# Patient Record
Sex: Female | Born: 1955
Health system: Southern US, Community
[De-identification: ages and names within clinical notes are randomized; demographics above are authoritative.]

## PROBLEM LIST (undated history)

## (undated) DIAGNOSIS — B009 Herpesviral infection, unspecified: Secondary | ICD-10-CM

## (undated) DIAGNOSIS — T7840XA Allergy, unspecified, initial encounter: Secondary | ICD-10-CM

## (undated) DIAGNOSIS — I10 Essential (primary) hypertension: Secondary | ICD-10-CM

## (undated) DIAGNOSIS — C801 Malignant (primary) neoplasm, unspecified: Secondary | ICD-10-CM

## (undated) DIAGNOSIS — E7800 Pure hypercholesterolemia, unspecified: Secondary | ICD-10-CM

## (undated) DIAGNOSIS — K649 Unspecified hemorrhoids: Secondary | ICD-10-CM

## (undated) DIAGNOSIS — Z889 Allergy status to unspecified drugs, medicaments and biological substances status: Secondary | ICD-10-CM

## (undated) DIAGNOSIS — Z86010 Personal history of colonic polyps: Secondary | ICD-10-CM

## (undated) DIAGNOSIS — E039 Hypothyroidism, unspecified: Secondary | ICD-10-CM

## (undated) DIAGNOSIS — D649 Anemia, unspecified: Secondary | ICD-10-CM

## (undated) HISTORY — DX: Allergy, unspecified, initial encounter: T78.40XA

## (undated) HISTORY — DX: Pure hypercholesterolemia, unspecified: E78.00

## (undated) HISTORY — DX: Unspecified hemorrhoids: K64.9

## (undated) HISTORY — DX: Personal history of colonic polyps: Z86.010

## (undated) HISTORY — PX: SALPINGOOPHORECTOMY: SHX82

## (undated) HISTORY — PX: BUNIONECTOMY: SHX129

---

## 1983-06-28 DIAGNOSIS — C801 Malignant (primary) neoplasm, unspecified: Secondary | ICD-10-CM

## 1983-06-28 HISTORY — DX: Malignant (primary) neoplasm, unspecified: C80.1

## 1994-06-27 HISTORY — PX: TUBAL LIGATION: SHX77

## 1998-03-06 ENCOUNTER — Ambulatory Visit (HOSPITAL_COMMUNITY): Admission: RE | Admit: 1998-03-06 | Discharge: 1998-03-06 | Payer: Self-pay | Admitting: *Deleted

## 1998-09-17 ENCOUNTER — Ambulatory Visit (HOSPITAL_COMMUNITY): Admission: RE | Admit: 1998-09-17 | Discharge: 1998-09-17 | Payer: Self-pay | Admitting: *Deleted

## 1999-02-26 ENCOUNTER — Ambulatory Visit (HOSPITAL_COMMUNITY): Admission: RE | Admit: 1999-02-26 | Discharge: 1999-02-26 | Payer: Self-pay | Admitting: Obstetrics and Gynecology

## 1999-02-26 ENCOUNTER — Encounter: Payer: Self-pay | Admitting: Obstetrics and Gynecology

## 1999-03-09 ENCOUNTER — Other Ambulatory Visit: Admission: RE | Admit: 1999-03-09 | Discharge: 1999-03-09 | Payer: Self-pay | Admitting: Obstetrics and Gynecology

## 1999-07-09 ENCOUNTER — Other Ambulatory Visit: Admission: RE | Admit: 1999-07-09 | Discharge: 1999-07-09 | Payer: Self-pay | Admitting: Obstetrics and Gynecology

## 2000-02-29 ENCOUNTER — Ambulatory Visit (HOSPITAL_COMMUNITY): Admission: RE | Admit: 2000-02-29 | Discharge: 2000-02-29 | Payer: Self-pay | Admitting: Pediatrics

## 2000-02-29 ENCOUNTER — Encounter: Payer: Self-pay | Admitting: Obstetrics and Gynecology

## 2000-05-15 ENCOUNTER — Other Ambulatory Visit: Admission: RE | Admit: 2000-05-15 | Discharge: 2000-05-15 | Payer: Self-pay | Admitting: Obstetrics and Gynecology

## 2001-03-02 ENCOUNTER — Ambulatory Visit (HOSPITAL_COMMUNITY): Admission: RE | Admit: 2001-03-02 | Discharge: 2001-03-02 | Payer: Self-pay | Admitting: Obstetrics and Gynecology

## 2001-03-02 ENCOUNTER — Encounter: Payer: Self-pay | Admitting: Obstetrics and Gynecology

## 2001-06-26 ENCOUNTER — Other Ambulatory Visit: Admission: RE | Admit: 2001-06-26 | Discharge: 2001-06-26 | Payer: Self-pay | Admitting: Obstetrics and Gynecology

## 2001-09-05 ENCOUNTER — Encounter: Payer: Self-pay | Admitting: Family Medicine

## 2001-09-05 ENCOUNTER — Encounter: Admission: RE | Admit: 2001-09-05 | Discharge: 2001-09-05 | Payer: Self-pay | Admitting: Family Medicine

## 2002-04-10 ENCOUNTER — Encounter: Payer: Self-pay | Admitting: Obstetrics and Gynecology

## 2002-04-10 ENCOUNTER — Ambulatory Visit (HOSPITAL_COMMUNITY): Admission: RE | Admit: 2002-04-10 | Discharge: 2002-04-10 | Payer: Self-pay | Admitting: Obstetrics and Gynecology

## 2002-07-11 ENCOUNTER — Other Ambulatory Visit: Admission: RE | Admit: 2002-07-11 | Discharge: 2002-07-11 | Payer: Self-pay | Admitting: Obstetrics and Gynecology

## 2003-04-07 ENCOUNTER — Encounter: Payer: Self-pay | Admitting: Obstetrics and Gynecology

## 2003-04-07 ENCOUNTER — Encounter: Admission: RE | Admit: 2003-04-07 | Discharge: 2003-04-07 | Payer: Self-pay | Admitting: Obstetrics and Gynecology

## 2003-08-06 ENCOUNTER — Other Ambulatory Visit: Admission: RE | Admit: 2003-08-06 | Discharge: 2003-08-06 | Payer: Self-pay | Admitting: Obstetrics and Gynecology

## 2004-04-12 ENCOUNTER — Encounter: Admission: RE | Admit: 2004-04-12 | Discharge: 2004-04-12 | Payer: Self-pay | Admitting: Obstetrics and Gynecology

## 2005-05-03 ENCOUNTER — Other Ambulatory Visit: Admission: RE | Admit: 2005-05-03 | Discharge: 2005-05-03 | Payer: Self-pay | Admitting: Obstetrics and Gynecology

## 2005-05-31 ENCOUNTER — Encounter: Admission: RE | Admit: 2005-05-31 | Discharge: 2005-05-31 | Payer: Self-pay | Admitting: Obstetrics and Gynecology

## 2006-06-19 ENCOUNTER — Encounter: Admission: RE | Admit: 2006-06-19 | Discharge: 2006-06-19 | Payer: Self-pay | Admitting: Obstetrics and Gynecology

## 2006-09-06 ENCOUNTER — Other Ambulatory Visit: Admission: RE | Admit: 2006-09-06 | Discharge: 2006-09-06 | Payer: Self-pay | Admitting: Obstetrics and Gynecology

## 2007-07-14 ENCOUNTER — Encounter: Admission: RE | Admit: 2007-07-14 | Discharge: 2007-07-14 | Payer: Self-pay | Admitting: Family Medicine

## 2007-07-19 ENCOUNTER — Encounter: Admission: RE | Admit: 2007-07-19 | Discharge: 2007-07-19 | Payer: Self-pay | Admitting: Obstetrics and Gynecology

## 2007-07-24 ENCOUNTER — Encounter: Admission: RE | Admit: 2007-07-24 | Discharge: 2007-07-24 | Payer: Self-pay | Admitting: Obstetrics and Gynecology

## 2007-08-07 ENCOUNTER — Encounter: Admission: RE | Admit: 2007-08-07 | Discharge: 2007-08-07 | Payer: Self-pay | Admitting: Obstetrics and Gynecology

## 2007-08-15 ENCOUNTER — Encounter: Admission: RE | Admit: 2007-08-15 | Discharge: 2007-08-15 | Payer: Self-pay | Admitting: Obstetrics and Gynecology

## 2007-10-29 ENCOUNTER — Other Ambulatory Visit: Admission: RE | Admit: 2007-10-29 | Discharge: 2007-10-29 | Payer: Self-pay | Admitting: Obstetrics and Gynecology

## 2007-11-13 ENCOUNTER — Ambulatory Visit: Payer: Self-pay | Admitting: Internal Medicine

## 2007-11-26 ENCOUNTER — Telehealth (INDEPENDENT_AMBULATORY_CARE_PROVIDER_SITE_OTHER): Payer: Self-pay | Admitting: *Deleted

## 2007-11-26 ENCOUNTER — Encounter: Payer: Self-pay | Admitting: Internal Medicine

## 2007-11-27 ENCOUNTER — Ambulatory Visit: Payer: Self-pay | Admitting: Internal Medicine

## 2008-07-30 ENCOUNTER — Encounter: Admission: RE | Admit: 2008-07-30 | Discharge: 2008-07-30 | Payer: Self-pay | Admitting: Obstetrics and Gynecology

## 2008-11-07 ENCOUNTER — Other Ambulatory Visit: Admission: RE | Admit: 2008-11-07 | Discharge: 2008-11-07 | Payer: Self-pay | Admitting: Obstetrics and Gynecology

## 2009-09-09 ENCOUNTER — Encounter: Admission: RE | Admit: 2009-09-09 | Discharge: 2009-09-09 | Payer: Self-pay | Admitting: Obstetrics and Gynecology

## 2010-12-02 ENCOUNTER — Other Ambulatory Visit: Payer: Self-pay | Admitting: Obstetrics and Gynecology

## 2010-12-02 DIAGNOSIS — Z1231 Encounter for screening mammogram for malignant neoplasm of breast: Secondary | ICD-10-CM

## 2010-12-07 ENCOUNTER — Ambulatory Visit
Admission: RE | Admit: 2010-12-07 | Discharge: 2010-12-07 | Disposition: A | Discharge status: Home / Self Care | Payer: 59 | Source: Ambulatory Visit | Attending: Obstetrics and Gynecology | Admitting: Obstetrics and Gynecology

## 2010-12-07 DIAGNOSIS — Z1231 Encounter for screening mammogram for malignant neoplasm of breast: Secondary | ICD-10-CM

## 2011-01-24 ENCOUNTER — Emergency Department (HOSPITAL_COMMUNITY)
Admission: EM | Admit: 2011-01-24 | Discharge: 2011-01-24 | Disposition: A | Discharge status: Home / Self Care | Payer: 59 | Attending: Emergency Medicine | Admitting: Emergency Medicine

## 2011-01-24 ENCOUNTER — Emergency Department (HOSPITAL_COMMUNITY): Payer: 59

## 2011-01-24 DIAGNOSIS — R141 Gas pain: Secondary | ICD-10-CM | POA: Insufficient documentation

## 2011-01-24 DIAGNOSIS — R142 Eructation: Secondary | ICD-10-CM | POA: Insufficient documentation

## 2011-01-24 DIAGNOSIS — Z79899 Other long term (current) drug therapy: Secondary | ICD-10-CM | POA: Insufficient documentation

## 2011-01-24 DIAGNOSIS — E7800 Pure hypercholesterolemia, unspecified: Secondary | ICD-10-CM | POA: Insufficient documentation

## 2011-01-24 DIAGNOSIS — R42 Dizziness and giddiness: Secondary | ICD-10-CM | POA: Insufficient documentation

## 2011-01-24 DIAGNOSIS — R319 Hematuria, unspecified: Secondary | ICD-10-CM | POA: Insufficient documentation

## 2011-01-24 LAB — DIFFERENTIAL
Basophils Relative: 0 % (ref 0–1)
Eosinophils Absolute: 0 10*3/uL (ref 0.0–0.7)
Lymphs Abs: 1.7 10*3/uL (ref 0.7–4.0)
Neutro Abs: 5.6 10*3/uL (ref 1.7–7.7)
Neutrophils Relative %: 71 % (ref 43–77)

## 2011-01-24 LAB — BASIC METABOLIC PANEL
BUN: 15 mg/dL (ref 6–23)
Calcium: 9.7 mg/dL (ref 8.4–10.5)
Creatinine, Ser: 0.64 mg/dL (ref 0.50–1.10)
GFR calc Af Amer: >60 mL/min (ref >60)
GFR calc non Af Amer: >60 mL/min (ref >60)
Glucose, Bld: 102 mg/dL — ABNORMAL HIGH (ref 70–99)
Potassium: 3.9 mEq/L (ref 3.5–5.1)

## 2011-01-24 LAB — CBC
MCH: 28.6 pg (ref 26.0–34.0)
MCHC: 34.1 g/dL (ref 30.0–36.0)
RBC: 4.51 MIL/uL (ref 3.87–5.11)
RDW: 12.8 % (ref 11.5–15.5)
WBC: 7.8 10*3/uL (ref 4.0–10.5)

## 2011-01-24 LAB — URINALYSIS, ROUTINE W REFLEX MICROSCOPIC
Bilirubin Urine: NEGATIVE
Ketones, ur: NEGATIVE mg/dL
Nitrite: NEGATIVE
Urobilinogen, UA: 0.2 mg/dL (ref 0.0–1.0)

## 2011-01-24 LAB — URINE MICROSCOPIC-ADD ON

## 2011-01-24 LAB — POCT PREGNANCY, URINE: Preg Test, Ur: NEGATIVE

## 2012-01-05 ENCOUNTER — Other Ambulatory Visit: Payer: Self-pay | Admitting: Obstetrics and Gynecology

## 2012-01-05 DIAGNOSIS — Z1231 Encounter for screening mammogram for malignant neoplasm of breast: Secondary | ICD-10-CM

## 2012-01-09 ENCOUNTER — Ambulatory Visit
Admission: RE | Admit: 2012-01-09 | Discharge: 2012-01-09 | Disposition: A | Discharge status: Home / Self Care | Payer: 59 | Source: Ambulatory Visit | Attending: Obstetrics and Gynecology | Admitting: Obstetrics and Gynecology

## 2012-01-09 DIAGNOSIS — Z1231 Encounter for screening mammogram for malignant neoplasm of breast: Secondary | ICD-10-CM

## 2012-12-31 ENCOUNTER — Encounter: Payer: Self-pay | Admitting: Obstetrics and Gynecology

## 2013-01-02 ENCOUNTER — Encounter: Payer: Self-pay | Admitting: Obstetrics and Gynecology

## 2013-01-02 ENCOUNTER — Ambulatory Visit: Payer: Self-pay | Admitting: Obstetrics and Gynecology

## 2013-01-02 DIAGNOSIS — Z01419 Encounter for gynecological examination (general) (routine) without abnormal findings: Secondary | ICD-10-CM

## 2013-02-28 ENCOUNTER — Other Ambulatory Visit: Payer: Self-pay

## 2013-02-28 DIAGNOSIS — Z1231 Encounter for screening mammogram for malignant neoplasm of breast: Secondary | ICD-10-CM

## 2013-03-19 ENCOUNTER — Ambulatory Visit
Admission: RE | Admit: 2013-03-19 | Discharge: 2013-03-19 | Disposition: A | Discharge status: Home / Self Care | Payer: 59 | Source: Ambulatory Visit

## 2013-03-19 DIAGNOSIS — Z1231 Encounter for screening mammogram for malignant neoplasm of breast: Secondary | ICD-10-CM

## 2014-04-08 ENCOUNTER — Other Ambulatory Visit: Payer: Self-pay

## 2014-04-08 DIAGNOSIS — Z1231 Encounter for screening mammogram for malignant neoplasm of breast: Secondary | ICD-10-CM

## 2014-04-18 ENCOUNTER — Ambulatory Visit
Admission: RE | Admit: 2014-04-18 | Discharge: 2014-04-18 | Disposition: A | Discharge status: Home / Self Care | Payer: 59 | Source: Ambulatory Visit

## 2014-04-18 DIAGNOSIS — Z1231 Encounter for screening mammogram for malignant neoplasm of breast: Secondary | ICD-10-CM

## 2014-04-28 ENCOUNTER — Encounter: Payer: Self-pay | Admitting: Obstetrics and Gynecology

## 2014-05-02 ENCOUNTER — Encounter: Payer: Self-pay | Admitting: Podiatrist

## 2014-05-02 ENCOUNTER — Ambulatory Visit (INDEPENDENT_AMBULATORY_CARE_PROVIDER_SITE_OTHER): Payer: 59 | Admitting: Podiatrist

## 2014-05-02 ENCOUNTER — Ambulatory Visit (INDEPENDENT_AMBULATORY_CARE_PROVIDER_SITE_OTHER): Payer: 59

## 2014-05-02 VITALS — BP 115/62 | HR 80 | Resp 16

## 2014-05-02 DIAGNOSIS — M205X9 Other deformities of toe(s) (acquired), unspecified foot: Secondary | ICD-10-CM

## 2014-05-02 DIAGNOSIS — M2010 Hallux valgus (acquired), unspecified foot: Secondary | ICD-10-CM

## 2014-05-02 NOTE — Patient Instructions (Addendum)
Pre-Operative Instructions  Congratulations, you have decided to take an important step to improving your quality of life.  You can be assured that the doctors of [facility] will be with you every step of the way.  1. Plan to be at the surgery center/hospital at least 1 (one) hour prior to your scheduled time unless otherwise directed by the surgical center/hospital staff.  You must have a responsible adult accompany you, remain during the surgery and drive you home.  Make sure you have directions to the surgical center/hospital and know how to get there on time. 2. For hospital based surgery you will need to obtain a history and physical form from your family physician within 1 month prior to the date of surgery- we will give you a form for you primary physician.  3. We make every effort to accommodate the date you request for surgery.  There are however, times where surgery dates or times have to be moved.  We will contact you as soon as possible if a change in schedule is required.   4. No Aspirin/Ibuprofen for one week before surgery.  If you are on aspirin, any non-steroidal anti-inflammatory medications (Mobic, Aleve, Ibuprofen) you should stop taking it 7 days prior to your surgery.  You make take Tylenol  For pain prior to surgery.  5. Medications- If you are taking daily heart and blood pressure medications, seizure, reflux, allergy, asthma, anxiety, pain or diabetes medications, make sure the surgery center/hospital is aware before the day of surgery so they may notify you which medications to take or avoid the day of surgery. 6. No food or drink after midnight the night before surgery unless directed otherwise by surgical center/hospital staff. 7. No alcoholic beverages 24 hours prior to surgery.  No smoking 24 hours prior to or 24 hours after surgery. 8. Wear loose pants or shorts- loose enough to fit over bandages, boots, and casts. 9. No slip on shoes, sneakers are best. 10. Bring  your boot with you to the surgery center/hospital.  Also bring crutches or a walker if your physician has prescribed it for you.  If you do not have this equipment, it will be provided for you after surgery. 11. If you have not been contracted by the surgery center/hospital by the day before your surgery, call to confirm the date and time of your surgery. 12. Leave-time from work may vary depending on the type of surgery you have.  Appropriate arrangements should be made prior to surgery with your employer. 13. Prescriptions will be provided immediately following surgery by your doctor.  Have these filled as soon as possible after surgery and take the medication as directed. 14. Remove nail polish on the operative foot. 15. Wash the night before surgery.  The night before surgery wash the foot and leg well with the antibacterial soap provided and water paying special attention to beneath the toenails and in between the toes.  Rinse thoroughly with water and dry well with a towel.  Perform this wash unless told not to do so by your physician.  Enclosed: 1 Ice pack (please put in freezer the night before surgery)   1 Hibiclens skin cleaner   Pre-op Instructions  If you have any questions regarding the instructions, do not hesitate to call our office.  [place]: [address] [place], [place] [address] [phone]  [place]: [address] [place], [place] [address] [phone]  [place]: [address]  [place], [place] [address] [phone]  Dr. Richard   Tuchman DPM, Dr. Ila Mcgill DPM Dr. Harriet Masson DPM, Dr. Lanelle Bal DPM, Dr. Trudie Buckler DPM  Bunionectomy A bunionectomy is surgery to remove a bunion. A bunion is an enlargement of the joint at the base of the big toe. It is made up of bone and soft tissue on the inside part of the joint. Over time, a painful lump appears on the inside of the joint. The big toe begins to point inward toward the second toe. New bone growth can occur  and a bone spur may form. The pain eventually causes difficulty walking. A bunion usually results from inflammation caused by the irritation of poorly fitting shoes. It often begins later in life. A bunionectomy is performed when nonsurgical treatment no longer works. When surgery is needed, the extent of the procedure will depend on the degree of deformity of the foot. Your surgeon will discuss with you the different procedures and what will work best for you depending on your age and health. LET YOUR CAREGIVER KNOW ABOUT:  16. Previous problems with anesthetics or medicines used to numb the skin. 17. Allergies to dyes, iodine, foods, and/or latex. 18. Medicines taken including herbs, eye drops, prescription medicines (especially medicines used to "thin the blood"), aspirin and other over-the-counter medicines, and steroids (by mouth or as a cream). 19. History of bleeding or blood problems. 20. Possibility of pregnancy, if this applies. 21. History of blood clots in your legs and/or lungs . 22. Previous surgery. 23. Other important health problems. RISKS AND COMPLICATIONS   Infection.  Pain.  Nerve damage.  Possibility that the bunion will recur. BEFORE THE PROCEDURE  You should be present 60 minutes prior to your procedure or as directed.  PROCEDURE  Surgery is often done so that you can go home the same day (outpatient). It may be done in a hospital or in an outpatient surgical center. An anesthetic will be used to help you sleep during the procedure. Sometimes, a spinal anesthetic is used to make you numb below the waist. A cut (incision) is made over the swollen area at the first joint of the big toe. The enlarged lump will be removed. If there is a need to reposition the bones of the big toe, this may require more than 1 incision. The bone itself may need to be cut. Screws and wires may be used in the repair. These can be removed at a later date. In severe cases, the entire joint may  need to be removed and a joint replacement inserted. When done, the incision is closed with stitches (sutures). Skin adhesive strips may be added for reinforcement. They help hold the incision closed.  AFTER THE PROCEDURE  Compression bandages (dressings) are then wrapped around the wound. This helps to keep the foot in alignment and reduce swelling. Your foot will be monitored for bleeding and swelling. You will need to stay for a few hours in the recovery area before being discharged. This allows time for the anesthesia to wear off. You will be discharged home when you are awake, stable, and doing well. HOME CARE INSTRUCTIONS   You can expect to return to normal activities within 6 to 8 weeks after surgery. The foot is at increased risk for swelling for several months. When you can expect to bear weight on the operated foot will depend on the extent of your surgery. The milder the deformity, the less tissue is removed and the sooner the return to normal activity level. During the  recovery period, a special shoe, boot, or cast may be worn to accommodate the surgical bandage and to help provide stability to the foot.  Once you are home, an ice pack applied to the operative site may help with discomfort and keep swelling down. Stop using the ice if it causes discomfort.  Keep your feet raised (elevated) when possible to lessen swelling.  If you have an elastic bandage on your foot and you have numbness, tingling, or your foot becomes cold and blue, adjust the bandage to make it comfortable.  Change dressings as directed.  Keep the wound dry and clean. The wound may be washed gently with soap and water. Gently blot dry without rubbing. Do not take baths or use swimming pools or hot tubs for 10 days, or as instructed by your caregiver.  Only take over-the-counter or prescription medicines for pain, discomfort, or fever as directed by your caregiver.  You may continue a normal diet as directed.  For  activity, use crutches with no weight bearing or your orthopedic shoe as directed. Continue to use crutches or a cane as directed until you can stand without causing pain. SEEK MEDICAL CARE IF:   You have redness, swelling, bruising, or increasing pain in the wound.  There is pus coming from the wound.  You have drainage from a wound lasting longer than 1 day.  You have an oral temperature above 102 F (38.9 C).  You notice a bad smell coming from the wound or dressing.  The wound breaks open after sutures have been removed.  You develop dizzy episodes or fainting while standing.  You have persistent nausea or vomiting.  Your toes become cold.  Pain is not relieved with medicines. SEEK IMMEDIATE MEDICAL CARE IF:   You develop a rash.  You have difficulty breathing.  You develop any reaction or side effects to medicines given.  Your toes are numb or blue, or you have severe pain. MAKE SURE YOU:   Understand these instructions.  Will watch your condition.  Will get help right away if you are not doing well or get worse. Document Released: 05/27/2005 Document Revised: 09/05/2011 Document Reviewed: 07/02/2007 [facility] Patient Information 2015 [place], [place]. This information is not intended to replace advice given to you by your health care provider. Make sure you discuss any questions you have with your health care provider.

## 2014-05-02 NOTE — Progress Notes (Signed)
   Subjective:    Patient ID: Brittany Ali, female    DOB: Dec 16, 1955, 57 y.o.   MRN: [id number]  HPI Comments: "I have bunions"  Patient c/o aching 1st MPJ bilateral for about 1 year. The are red and swollen. Shoes are uncomfortable. She is an active runner but has had to stop completely.  Foot Pain Associated symptoms include arthralgias.      Review of Systems  Constitutional: Positive for activity change.  Musculoskeletal: Positive for arthralgias and gait problem.  All other systems reviewed and are negative.      Objective:   Physical Exam Patient is awake, alert, and oriented x 3.  In no acute distress.  Vascular status is intact with palpable pedal pulses at 2/4 DP and PT bilateral and capillary refill time within normal limits. Neurological sensation is also intact bilaterally via Semmes Weinstein monofilament at 5/5 sites. Light touch, vibratory sensation, Achilles tendon reflex is intact. Dermatological exam reveals skin color, turger and texture as normal. No open lesions present.  Musculature intact with dorsiflexion, plantarflexion, inversion, eversion.  Hallux limitus rigidus is noted on the right great toe joint. Crepitus and pain with range of motion is noted. X-ray showed decrease in range of motion at the joint itself.       Assessment & Plan:  Right foot hallux limitus-  Plan:  Discussed conservative versus surgical options. Recommended a repair of the hallux limitus to clean up the joint and allow for better range of motion. The consent form was discussed and all three pages were signed and the patient's questions were encouraged and answered to the best of my ability. Risks of the surgery were discussed including but not limited to continued pain, infection, swelling, elevated toe, decreased range of motion,  suture or implant reaction, bleeding, decreased function, etc. Preoperative instructions were also dispensed to the patient as well as a preoperative  surgical pamphlet to go along with the instructions. Surgery will be scheduled at the patients convenience and patient will be seen at [facility] specialty surgery center on outpatient basis.The patient is instructed to call if any questions or concerns arise.

## 2014-05-05 ENCOUNTER — Telehealth: Payer: Self-pay | Admitting: *Deleted

## 2014-05-05 NOTE — Telephone Encounter (Signed)
I'm calling to speak to Mrs. Brittany Ali.  I was there on Friday.  I need to schedule foot surgery.  I was to check my calendar to see what was best suitable and now I'm calling to see if it works for you guys.  Either my work number or my cell number will work for me.  Thank you.

## 2014-05-05 NOTE — Telephone Encounter (Signed)
"  I'd like to schedule surgery for 11/18."  I asked if she signed consent forms.  "I saw Dr. Valentina Lucks on Friday and signed them  She told me to call and schedule a date.  I'd like to do it as soon as possible.  She said it would take 45 minutes to do the procedure.  She also said I should get a boot which I didn't.  So I guess I need to come by there to get one?"  I told her I would call her back.  I am not aware of the procedure and Dr. Valentina Lucks will be out of the office until tomorrow in [place] office. She stated, "That is fine, please go ahead and pencil me in for Wednesday of next week."

## 2014-05-06 NOTE — Telephone Encounter (Signed)
I'll just get the surgery center to get her the boot-- just make sure she has the forms to fill out for the surgery center. Thanks!

## 2014-05-07 NOTE — Telephone Encounter (Signed)
I called and scheduled the surgery.

## 2014-05-07 NOTE — Telephone Encounter (Signed)
"  I thought I'd call you back since I didn't hear from you yesterday.  I want to know the status.  Will I be able to have the surgery on Wednesday?"  I told her it shouldn't be a problem.  Dr. Valentina Lucks was not in the office yesterday.  She will be here today and I can find out the procedure.  "I thought she did surgery on Wednesday."  She does on Wednesday morning.  I asked if she received a surgical packet.  She stated she did.  I asked her to go ahead and fill those forms out.  The surgical center will call you a day or two ahead of time and give you your arrival time.  I will call you and let you know when I get it scheduled.

## 2014-05-07 NOTE — Telephone Encounter (Signed)
I called and informed the patient that Dr. Valentina Lucks will be out of the office next week.  We will have to reschedule the surgery.  "Just my luck, I guess we'll just be able to do only one foot this year.  Can we schedule it for 06/11/2014?"  I told her that date is fine.   I called and left Caren Griffins a message that surgery needs to be rescheduled from 05/14/2014 to 06/11/2014.

## 2014-05-08 ENCOUNTER — Telehealth: Payer: Self-pay | Admitting: *Deleted

## 2014-05-08 NOTE — Telephone Encounter (Signed)
"  My husband and I have discussed it an we've decided to go ahead and do the surgery on November 25.  If you could leave me on for December 16, I want to have my left foot done that day.  If I have problems with pain or any other issues I can cancel the second foot at any time correct?"  I told her yes, you can cancel at anytime.  "Will you call me to confirm the surgery?"  I told her it will definitely be scheduled for these days.  "That's what you told me last time and look what happened."  I told her again it will be taken care of with no problems.  I will inform Dr. Valentina Lucks.  I called and informed Caren Griffins at the surgical center to reschedule her surgery from 06/11/2014 to 05/21/2014 for her right foot and schedule her tentatively for her left foot on 06/11/2014.

## 2014-05-21 ENCOUNTER — Encounter: Payer: 59 | Admitting: Podiatrist

## 2014-05-21 DIAGNOSIS — M2011 Hallux valgus (acquired), right foot: Secondary | ICD-10-CM

## 2014-05-26 ENCOUNTER — Telehealth: Payer: Self-pay | Admitting: *Deleted

## 2014-05-26 NOTE — Telephone Encounter (Signed)
Pt asked how long should she be on her feet after bunion surgery on 05/21/2014.  I told pt no more than 5 minutes per hour the 1st week post-op and may increase by 5 minutes each hour per weeks post-op.  Pt agreed.

## 2014-05-29 ENCOUNTER — Ambulatory Visit (INDEPENDENT_AMBULATORY_CARE_PROVIDER_SITE_OTHER): Payer: 59 | Admitting: Podiatrist

## 2014-05-29 ENCOUNTER — Encounter: Payer: Self-pay | Admitting: Podiatrist

## 2014-05-29 ENCOUNTER — Ambulatory Visit (INDEPENDENT_AMBULATORY_CARE_PROVIDER_SITE_OTHER): Payer: 59

## 2014-05-29 VITALS — BP 126/53 | HR 84 | Resp 13

## 2014-05-29 DIAGNOSIS — M2011 Hallux valgus (acquired), right foot: Secondary | ICD-10-CM

## 2014-05-29 DIAGNOSIS — M21611 Bunion of right foot: Secondary | ICD-10-CM

## 2014-05-29 DIAGNOSIS — Z9889 Other specified postprocedural states: Secondary | ICD-10-CM

## 2014-05-29 NOTE — Progress Notes (Signed)
Subjective: Patient presents today1 week status post foot surgery of the right foot.  Date of surgery 05/21/14-  A [place] osteotomy was performed. Patient denies nausea, vomiting, fevers, chills or night sweats.  Denies calf pain or tenderness to the operative side. Relates she has not been taking much pain medication for the foot and feelis it is improving daily.    Objective:  Neurovascular status is intact with palpable pedal pulses DP and PT bilateral at 2+ out of 4. Neurological sensation is intact and unchanged as per prior to surgery. Excellent appearance of the postoperative foot is noted clinically and radiographically.   Assessment: Status post right Arnetha Gula 05/21/14  Plan:  Dressing removed and a new dsd applied.  Dispensed a darco shoe and in 1 week she may get the foot wet and start wearing the darco shoe.  Discussed surgery on the left foot consisting of a austin bunoin correction.  She is scheduled at [facility] for this surgery.  If any concerns arise prior to that visit she will call.

## 2014-06-09 ENCOUNTER — Telehealth: Payer: Self-pay | Admitting: *Deleted

## 2014-06-09 NOTE — Telephone Encounter (Signed)
I called and informed the patient that Dr. Valentina Lucks said it would be okay to have the procedure as long as there is no chest congestion.  "Okay, I had already spoken to the surgical center, they called.  They told me the same thing so we should be good to go.  I only had a head cold and it was in my throat.  Thanks for calling me back."

## 2014-06-09 NOTE — Telephone Encounter (Signed)
I'm scheduled for surgery tomorrow at 12 noon.  I had a cold all last week.  They said it depends on how I felt today.  I feel 100% better but I did go to Urgent Care on Saturday because I was feeling pretty rough.  They put me on Amoxicillin 500 mg and some Claritussin AC which I guess has a little Codeine in it.  I took that for the last time last night.  I haven't taken any today because I'm at work today.  I want to make sure I can still have the surgery being on the Amoxicillin?  I know you guys may give me some type of antibiotic anyway so it shouldn't matter.

## 2014-06-09 NOTE — Telephone Encounter (Signed)
Yes, that's totally fine to be on an antibiotic and any type of cold medicine is fine as well  The only thing anesthesia worries about is really bad chest congestion -- but as long as she doesn't have this, she should be just fine.  Thanks!  E

## 2014-06-10 DIAGNOSIS — M2012 Hallux valgus (acquired), left foot: Secondary | ICD-10-CM

## 2014-06-18 ENCOUNTER — Other Ambulatory Visit: Payer: Self-pay | Admitting: Podiatrist

## 2014-06-18 ENCOUNTER — Ambulatory Visit (INDEPENDENT_AMBULATORY_CARE_PROVIDER_SITE_OTHER): Payer: 59 | Admitting: Podiatrist

## 2014-06-18 ENCOUNTER — Encounter: Payer: 59 | Admitting: Podiatrist

## 2014-06-18 ENCOUNTER — Ambulatory Visit (INDEPENDENT_AMBULATORY_CARE_PROVIDER_SITE_OTHER): Payer: 59

## 2014-06-18 ENCOUNTER — Encounter: Payer: Self-pay | Admitting: Podiatrist

## 2014-06-18 VITALS — BP 118/62 | HR 70 | Resp 16

## 2014-06-18 DIAGNOSIS — Z9889 Other specified postprocedural states: Secondary | ICD-10-CM

## 2014-06-18 DIAGNOSIS — M21611 Bunion of right foot: Secondary | ICD-10-CM

## 2014-06-18 DIAGNOSIS — M2011 Hallux valgus (acquired), right foot: Secondary | ICD-10-CM

## 2014-06-18 DIAGNOSIS — M2012 Hallux valgus (acquired), left foot: Secondary | ICD-10-CM

## 2014-06-18 NOTE — Progress Notes (Signed)
Chief Complaint  Patient presents with  . Routine Post Op    DOS 06-11-2014 POV [place] bunionectomy left   "Good. Not as good as the other foot though"  . Routine Post Op    DOS 05-21-2014 POV [place] bunonectomy right   "Looking so good"     HPI: Patient is 58 y.o. female who presents today for postoperative follow-up regarding bilateral foot surgery. She relates the right foot is doing excellent she's having no problems in the left foot is slower to come around. She did however just have surgery on the left foot last week.   Allergies  Allergen Reactions  . Adhesive [Tape]     Physical Exam  Neurovascular status is intact and unchanged. Bruising and ecchymosis is present on the left foot. Incision site is well coapted. Mild swelling is present. Range of motion is adequate. Range of motion is excellent on the right foot. X-rays show well-healing surgical foot with excellent appearance of osteotomies bilateral and screw fixation in good position and alignment. Overall good postoperative improvement is noted.  Assessment: Status post right foot surgery on 05/21/2014; and status post left foot surgery on 06/11/2014  Plan: Redressed the left foot in a dry in sterile compressive dressing. She may get the foot wet next week. She may also wear the Darco shoe on the left foot next week. On the right foot a compressive glue was also dispensed. She may wean into a good supportive tennis shoe or sneaker. She is given instructions for range of motion exercises. I will see her back in 2-3 weeks for follow-up.

## 2014-06-18 NOTE — Patient Instructions (Signed)
Continue with your range of motion exercises on your great toes-- also pull the toes out and push the toe up   Complete the exercise 50 times and repeat 3-4 times a day.

## 2014-07-10 ENCOUNTER — Other Ambulatory Visit (HOSPITAL_COMMUNITY)
Admission: RE | Admit: 2014-07-10 | Discharge: 2014-07-10 | Disposition: A | Discharge status: Home / Self Care | Payer: 59 | Source: Ambulatory Visit | Attending: Family Medicine | Admitting: Family Medicine

## 2014-07-10 ENCOUNTER — Other Ambulatory Visit: Payer: Self-pay | Admitting: Family Medicine

## 2014-07-10 DIAGNOSIS — Z124 Encounter for screening for malignant neoplasm of cervix: Secondary | ICD-10-CM | POA: Diagnosis not present

## 2014-07-10 DIAGNOSIS — Z1151 Encounter for screening for human papillomavirus (HPV): Secondary | ICD-10-CM | POA: Insufficient documentation

## 2014-07-11 ENCOUNTER — Ambulatory Visit (INDEPENDENT_AMBULATORY_CARE_PROVIDER_SITE_OTHER): Payer: 59

## 2014-07-11 ENCOUNTER — Encounter: Payer: Self-pay | Admitting: Podiatrist

## 2014-07-11 ENCOUNTER — Ambulatory Visit (INDEPENDENT_AMBULATORY_CARE_PROVIDER_SITE_OTHER): Payer: 59 | Admitting: Podiatrist

## 2014-07-11 VITALS — BP 122/60 | HR 71 | Resp 16

## 2014-07-11 DIAGNOSIS — Z9889 Other specified postprocedural states: Secondary | ICD-10-CM

## 2014-07-11 DIAGNOSIS — M2010 Hallux valgus (acquired), unspecified foot: Secondary | ICD-10-CM

## 2014-07-11 NOTE — Progress Notes (Signed)
Chief Complaint  Patient presents with  . Routine Post Op    DOS 06-11-2014 POV [place] bunionectomy left   "Seems like its taking longer to heal, but maybe cause it was the lastest"  . Routine Post Op    DOS 05-21-2014 POV [place] bunonectomy right   "It just has like a bruised like feeling"     HPI: Patient is 59 y.o. female who presents today for postoperative follow-up status post left and right bunion surgery correction. Patient states overall she is doing well and is getting along okay postoperatively. Allergies  Allergen Reactions  . Adhesive [Tape]      Physical Exam  Neurovascular status is intact and unchanged. swelling is present on the left foot  Mild swelling still present right. Incision site is well coapted. . Range of motion is adequate on the left foot but improved. Range of motion is excellent on the right foot.   X-rays show a healed osteotomy on the right foot and a well-healing osteotomy on the left. Capital fragments are in good alignment and position with screw fixation in place.   Assessment: Status post right foot surgery on 05/21/2014; and status post left foot surgery on 06/11/2014  Plan: Recommended continued range of motion exercises bilaterally. Also recommended use of a good supportive sneaker on both feet. She will continue wearing the anklet on the left foot and discontinue use of it on the right. I'll see her back in 1 month for reevaluation and re-x-ray and at that time I would expect less swelling on the left than at today's visit. Also note she did recently go bowling and states that she had some swelling on the left foot however the right did wonderfully.

## 2014-07-14 LAB — CYTOLOGY - PAP

## 2014-08-08 ENCOUNTER — Encounter: Payer: 59 | Admitting: Podiatrist

## 2014-08-13 ENCOUNTER — Ambulatory Visit (INDEPENDENT_AMBULATORY_CARE_PROVIDER_SITE_OTHER): Payer: 59 | Admitting: Podiatrist

## 2014-08-13 ENCOUNTER — Ambulatory Visit (INDEPENDENT_AMBULATORY_CARE_PROVIDER_SITE_OTHER): Payer: 59

## 2014-08-13 ENCOUNTER — Encounter: Payer: Self-pay | Admitting: Podiatrist

## 2014-08-13 VITALS — BP 100/56 | HR 79 | Resp 16

## 2014-08-13 DIAGNOSIS — M2010 Hallux valgus (acquired), unspecified foot: Secondary | ICD-10-CM

## 2014-08-20 NOTE — Progress Notes (Signed)
Chief Complaint  Patient presents with  . Routine Post Op    DOS 06-11-2014 POV [place] bunionectomy left  "Its feeling okay"  . Routine Post Op    DOS 05-21-2014 POV [place] bunonectomy right  "Some soreness but okay"     HPI: Patient is 59 y.o. female who presents today for postoperative follow-up status post left and right bunion surgery correction.   Allergies  Allergen Reactions  . Adhesive [Tape]      Physical Exam  Neurovascular status is intact.  Mild swelling still present right. Incision site is healed. . Range of motion is adequate on the left foot but improved. Range of motion is excellent on the right foot.   X-rays show a healed osteotomy on the right foot and a well-healing osteotomy on the left.   Assessment: Status post right foot surgery on 05/21/2014; and status post left foot surgery on 06/11/2014  Plan: Recommended continued range of motion exercises bilaterally. Also recommended use of a good supportive sneaker on both feet. She will continue to use compression when necessary.  Overall she is doing well and will call if any concerns arise.

## 2015-07-23 ENCOUNTER — Other Ambulatory Visit: Payer: Self-pay | Admitting: Orthopedic Surgery

## 2015-07-23 DIAGNOSIS — M532X8 Spinal instabilities, sacral and sacrococcygeal region: Secondary | ICD-10-CM

## 2015-07-31 ENCOUNTER — Other Ambulatory Visit: Payer: 59

## 2015-08-19 ENCOUNTER — Other Ambulatory Visit: Payer: Self-pay | Admitting: Orthopedic Surgery

## 2015-08-19 DIAGNOSIS — M5416 Radiculopathy, lumbar region: Secondary | ICD-10-CM

## 2015-08-19 DIAGNOSIS — M533 Sacrococcygeal disorders, not elsewhere classified: Secondary | ICD-10-CM

## 2015-09-10 ENCOUNTER — Other Ambulatory Visit: Payer: Self-pay | Admitting: Physician Assistant

## 2015-09-15 ENCOUNTER — Encounter: Payer: Self-pay | Admitting: Internal Medicine

## 2015-09-18 ENCOUNTER — Encounter (HOSPITAL_COMMUNITY)
Admission: RE | Admit: 2015-09-18 | Discharge: 2015-09-18 | Disposition: A | Discharge status: Home / Self Care | Payer: 59 | Source: Ambulatory Visit | Attending: Orthopaedic Surgery | Admitting: Orthopaedic Surgery

## 2015-09-18 ENCOUNTER — Other Ambulatory Visit: Payer: Self-pay

## 2015-09-18 ENCOUNTER — Encounter (HOSPITAL_COMMUNITY): Payer: Self-pay

## 2015-09-18 DIAGNOSIS — Z01810 Encounter for preprocedural cardiovascular examination: Secondary | ICD-10-CM | POA: Insufficient documentation

## 2015-09-18 DIAGNOSIS — Z01812 Encounter for preprocedural laboratory examination: Secondary | ICD-10-CM | POA: Insufficient documentation

## 2015-09-18 DIAGNOSIS — M1612 Unilateral primary osteoarthritis, left hip: Secondary | ICD-10-CM | POA: Diagnosis not present

## 2015-09-18 HISTORY — DX: Malignant (primary) neoplasm, unspecified: C80.1

## 2015-09-18 HISTORY — DX: Essential (primary) hypertension: I10

## 2015-09-18 HISTORY — DX: Herpesviral infection, unspecified: B00.9

## 2015-09-18 HISTORY — DX: Hypothyroidism, unspecified: E03.9

## 2015-09-18 HISTORY — DX: Allergy status to unspecified drugs, medicaments and biological substances: Z88.9

## 2015-09-18 HISTORY — DX: Anemia, unspecified: D64.9

## 2015-09-18 LAB — BASIC METABOLIC PANEL
Anion gap: 7 (ref 5–15)
BUN: 14 mg/dL (ref 6–20)
CALCIUM: 9.3 mg/dL (ref 8.9–10.3)
CHLORIDE: 107 mmol/L (ref 101–111)
CO2: 29 mmol/L (ref 22–32)
CREATININE: 0.7 mg/dL (ref 0.44–1.00)
GFR calc Af Amer: >60 mL/min (ref >60)
GLUCOSE: 91 mg/dL (ref 65–99)
Potassium: 4.4 mmol/L (ref 3.5–5.1)
Sodium: 143 mmol/L (ref 135–145)

## 2015-09-18 LAB — ABO/RH: ABO/RH(D): AB POS

## 2015-09-18 LAB — CBC
HEMATOCRIT: 36.5 % (ref 36.0–46.0)
HEMOGLOBIN: 12.4 g/dL (ref 12.0–15.0)
MCH: 29.2 pg (ref 26.0–34.0)
MCHC: 34 g/dL (ref 30.0–36.0)
MCV: 86.1 fL (ref 78.0–100.0)
Platelets: 269 10*3/uL (ref 150–400)
RBC: 4.24 MIL/uL (ref 3.87–5.11)
RDW: 13.6 % (ref 11.5–15.5)
WBC: 5.2 10*3/uL (ref 4.0–10.5)

## 2015-09-18 LAB — SURGICAL PCR SCREEN
MRSA, PCR: NEGATIVE
Staphylococcus aureus: NEGATIVE

## 2015-09-18 NOTE — Pre-Procedure Instructions (Signed)
EKG done today.

## 2015-09-18 NOTE — Patient Instructions (Addendum)
Brittany Ali  09/18/2015   Your procedure is scheduled on: 09-25-15  Report to [facility] Main  Entrance take [facility]  elevators to 3rd floor to  [place] at  [place] AM.  Call this number if you have problems the morning of surgery [phone]   Remember: ONLY 1 PERSON MAY GO WITH YOU TO SHORT STAY TO GET  READY MORNING OF [place].  Do not eat food or drink liquids :After Midnight.     Take these medicines the morning of surgery with A SIP OF WATER: Levothyroxine.  DO NOT TAKE ANY DIABETIC MEDICATIONS DAY OF YOUR SURGERY                               You may not have any metal on your body including hair pins and              piercings  Do not wear jewelry, make-up, lotions, powders or perfumes, deodorant             Do not wear nail polish.  Do not shave  48 hours prior to surgery.              Men may shave face and neck.   Do not bring valuables to the hospital. [place].  Contacts, dentures or bridgework may not be worn into surgery.  Leave suitcase in the car. After surgery it may be brought to your room.     Patients discharged the day of surgery will not be allowed to drive home.  Name and phone number of your driver: Jeffrey-spouse [phone] cell  Special Instructions: N/A              Please read over the following fact sheets you were given: _____________________________________________________________________             [facility] - Preparing for Surgery Before surgery, you can play an important role.  Because skin is not sterile, your skin needs to be as free of germs as possible.  You can reduce the number of germs on your skin by washing with CHG (chlorahexidine gluconate) soap before surgery.  CHG is an antiseptic cleaner which kills germs and bonds with the skin to continue killing germs even after washing. Please DO NOT use if you have an allergy to CHG or antibacterial  soaps.  If your skin becomes reddened/irritated stop using the CHG and inform your nurse when you arrive at Short Stay. Do not shave (including legs and underarms) for at least 48 hours prior to the first CHG shower.  You may shave your face/neck. Please follow these instructions carefully:  1.  Shower with CHG Soap the night before surgery and the  morning of Surgery.  2.  If you choose to wash your hair, wash your hair first as usual with your  normal  shampoo.  3.  After you shampoo, rinse your hair and body thoroughly to remove the  shampoo.                           4.  Use CHG as you would any other liquid soap.  You can apply chg directly  to the  skin and wash                       Gently with a scrungie or clean washcloth.  5.  Apply the CHG Soap to your body ONLY FROM THE NECK DOWN.   Do not use on face/ open                           Wound or open sores. Avoid contact with eyes, ears mouth and genitals (private parts).                       Wash face,  Genitals (private parts) with your normal soap.             6.  Wash thoroughly, paying special attention to the area where your surgery  will be performed.  7.  Thoroughly rinse your body with warm water from the neck down.  8.  DO NOT shower/wash with your normal soap after using and rinsing off  the CHG Soap.                9.  Pat yourself dry with a clean towel.            10.  Wear clean pajamas.            11.  Place clean sheets on your bed the night of your first shower and do not  sleep with pets. Day of Surgery : Do not apply any lotions/deodorants the morning of surgery.  Please wear clean clothes to the hospital/surgery center.  FAILURE TO FOLLOW THESE INSTRUCTIONS MAY RESULT IN THE CANCELLATION OF YOUR SURGERY PATIENT SIGNATURE_________________________________  NURSE SIGNATURE__________________________________  ________________________________________________________________________   Adam Phenix  An  incentive spirometer is a tool that can help keep your lungs clear and active. This tool measures how well you are filling your lungs with each breath. Taking long deep breaths may help reverse or decrease the chance of developing breathing (pulmonary) problems (especially infection) following:  A long period of time when you are unable to move or be active. BEFORE THE PROCEDURE   If the spirometer includes an indicator to show your best effort, your nurse or respiratory therapist will set it to a desired goal.  If possible, sit up straight or lean slightly forward. Try not to slouch.  Hold the incentive spirometer in an upright position. INSTRUCTIONS FOR USE  1. Sit on the edge of your bed if possible, or sit up as far as you can in bed or on a chair. 2. Hold the incentive spirometer in an upright position. 3. Breathe out normally. 4. Place the mouthpiece in your mouth and seal your lips tightly around it. 5. Breathe in slowly and as deeply as possible, raising the piston or the ball toward the top of the column. 6. Hold your breath for 3-5 seconds or for as long as possible. Allow the piston or ball to fall to the bottom of the column. 7. Remove the mouthpiece from your mouth and breathe out normally. 8. Rest for a few seconds and repeat Steps 1 through 7 at least 10 times every 1-2 hours when you are awake. Take your time and take a few normal breaths between deep breaths. 9. The spirometer may include an indicator to show your best effort. Use the indicator as a goal to work toward during each repetition. 10. After each set of  10 deep breaths, practice coughing to be sure your lungs are clear. If you have an incision (the cut made at the time of surgery), support your incision when coughing by placing a pillow or rolled up towels firmly against it. Once you are able to get out of bed, walk around indoors and cough well. You may stop using the incentive spirometer when instructed by your  caregiver.  RISKS AND COMPLICATIONS  Take your time so you do not get dizzy or light-headed.  If you are in pain, you may need to take or ask for pain medication before doing incentive spirometry. It is harder to take a deep breath if you are having pain. AFTER USE  Rest and breathe slowly and easily.  It can be helpful to keep track of a log of your progress. Your caregiver can provide you with a simple table to help with this. If you are using the spirometer at home, follow these instructions: [place] IF:   You are having difficultly using the spirometer.  You have trouble using the spirometer as often as instructed.  Your pain medication is not giving enough relief while using the spirometer.  You develop fever of 100.5 F (38.1 C) or higher. SEEK IMMEDIATE MEDICAL CARE IF:   You cough up bloody sputum that had not been present before.  You develop fever of 102 F (38.9 C) or greater.  You develop worsening pain at or near the incision site. MAKE SURE YOU:   Understand these instructions.  Will watch your condition.  Will get help right away if you are not doing well or get worse. Document Released: 10/24/2006 Document Revised: 09/05/2011 Document Reviewed: 12/25/2006 [facility] Patient Information 2014 [place], [place].   ________________________________________________________________________

## 2015-09-25 ENCOUNTER — Inpatient Hospital Stay (HOSPITAL_COMMUNITY): Payer: 59

## 2015-09-25 ENCOUNTER — Inpatient Hospital Stay (HOSPITAL_COMMUNITY): Payer: 59 | Admitting: Anesthesiology

## 2015-09-25 ENCOUNTER — Encounter (HOSPITAL_COMMUNITY): Payer: Self-pay | Admitting: *Deleted

## 2015-09-25 ENCOUNTER — Inpatient Hospital Stay (HOSPITAL_COMMUNITY)
Admission: RE | Admit: 2015-09-25 | Discharge: 2015-09-27 | DRG: 470 | Disposition: A | Discharge status: Home Health | Payer: 59 | Source: Ambulatory Visit | Attending: Orthopaedic Surgery | Admitting: Orthopaedic Surgery

## 2015-09-25 ENCOUNTER — Encounter (HOSPITAL_COMMUNITY)
Admission: RE | Disposition: A | Discharge status: Home Health | Payer: Self-pay | Source: Ambulatory Visit | Attending: Orthopaedic Surgery

## 2015-09-25 DIAGNOSIS — M1612 Unilateral primary osteoarthritis, left hip: Secondary | ICD-10-CM

## 2015-09-25 DIAGNOSIS — Z96642 Presence of left artificial hip joint: Secondary | ICD-10-CM

## 2015-09-25 DIAGNOSIS — Z419 Encounter for procedure for purposes other than remedying health state, unspecified: Secondary | ICD-10-CM

## 2015-09-25 DIAGNOSIS — E7800 Pure hypercholesterolemia, unspecified: Secondary | ICD-10-CM | POA: Diagnosis present

## 2015-09-25 DIAGNOSIS — Z01812 Encounter for preprocedural laboratory examination: Secondary | ICD-10-CM | POA: Diagnosis not present

## 2015-09-25 DIAGNOSIS — E039 Hypothyroidism, unspecified: Secondary | ICD-10-CM | POA: Diagnosis present

## 2015-09-25 DIAGNOSIS — I10 Essential (primary) hypertension: Secondary | ICD-10-CM | POA: Diagnosis present

## 2015-09-25 DIAGNOSIS — M25552 Pain in left hip: Secondary | ICD-10-CM | POA: Diagnosis present

## 2015-09-25 DIAGNOSIS — Z87891 Personal history of nicotine dependence: Secondary | ICD-10-CM | POA: Diagnosis not present

## 2015-09-25 HISTORY — PX: TOTAL HIP ARTHROPLASTY: SHX124

## 2015-09-25 LAB — TYPE AND SCREEN
ABO/RH(D): AB POS
ANTIBODY SCREEN: NEGATIVE

## 2015-09-25 SURGERY — ARTHROPLASTY, HIP, TOTAL, ANTERIOR APPROACH
Anesthesia: Monitor Anesthesia Care | Site: Hip | Laterality: Left

## 2015-09-25 MED ORDER — FENTANYL CITRATE (PF) 100 MCG/2ML IJ SOLN
INTRAMUSCULAR | Status: AC
  Filled 2015-09-25: qty 2

## 2015-09-25 MED ORDER — METHOCARBAMOL 500 MG PO TABS
500.0000 mg | ORAL_TABLET | Freq: Four times a day (QID) | ORAL | Status: DC | PRN
  Administered 2015-09-26 – 2015-09-27 (×4): 500 mg via ORAL
  Filled 2015-09-25 (×6): qty 1

## 2015-09-25 MED ORDER — HYDROMORPHONE HCL 1 MG/ML IJ SOLN: 0.2500 mg | INTRAMUSCULAR | Status: DC | PRN

## 2015-09-25 MED ORDER — ASPIRIN EC 325 MG PO TBEC
325.0000 mg | DELAYED_RELEASE_TABLET | Freq: Two times a day (BID) | ORAL | Status: DC
  Administered 2015-09-25 – 2015-09-27 (×4): 325 mg via ORAL
  Filled 2015-09-25 (×6): qty 1

## 2015-09-25 MED ORDER — DOCUSATE SODIUM 100 MG PO CAPS
100.0000 mg | ORAL_CAPSULE | Freq: Two times a day (BID) | ORAL | Status: DC
  Administered 2015-09-25 – 2015-09-27 (×4): 100 mg via ORAL

## 2015-09-25 MED ORDER — ACETAMINOPHEN 650 MG RE SUPP: 650.0000 mg | Freq: Four times a day (QID) | RECTAL | Status: DC | PRN

## 2015-09-25 MED ORDER — TRANEXAMIC ACID 1000 MG/10ML IV SOLN
1000.0000 mg | INTRAVENOUS | Status: AC
  Administered 2015-09-25: 1000 mg via INTRAVENOUS
  Filled 2015-09-25: qty 10

## 2015-09-25 MED ORDER — POLYETHYLENE GLYCOL 3350 17 G PO PACK: 17.0000 g | PACK | Freq: Every day | ORAL | Status: DC | PRN

## 2015-09-25 MED ORDER — BUPIVACAINE IN DEXTROSE 0.75-8.25 % IT SOLN
INTRATHECAL | Status: DC | PRN
  Administered 2015-09-25: 2 mL via INTRATHECAL

## 2015-09-25 MED ORDER — DEXAMETHASONE SODIUM PHOSPHATE 10 MG/ML IJ SOLN
INTRAMUSCULAR | Status: AC
  Filled 2015-09-25: qty 1

## 2015-09-25 MED ORDER — ACETAMINOPHEN 10 MG/ML IV SOLN
INTRAVENOUS | Status: DC | PRN
  Administered 2015-09-25: 1000 mg via INTRAVENOUS

## 2015-09-25 MED ORDER — ONDANSETRON HCL 4 MG/2ML IJ SOLN: 4.0000 mg | Freq: Four times a day (QID) | INTRAMUSCULAR | Status: DC | PRN

## 2015-09-25 MED ORDER — PHENYLEPHRINE 40 MCG/ML (10ML) SYRINGE FOR IV PUSH (FOR BLOOD PRESSURE SUPPORT)
PREFILLED_SYRINGE | INTRAVENOUS | Status: AC
  Filled 2015-09-25: qty 10

## 2015-09-25 MED ORDER — MENTHOL 3 MG MT LOZG: 1.0000 | LOZENGE | OROMUCOSAL | Status: DC | PRN

## 2015-09-25 MED ORDER — MIDAZOLAM HCL 2 MG/2ML IJ SOLN
INTRAMUSCULAR | Status: AC
  Filled 2015-09-25: qty 2

## 2015-09-25 MED ORDER — CHOLECALCIFEROL 10 MCG (400 UNIT) PO TABS
200.0000 [IU] | ORAL_TABLET | Freq: Every day | ORAL | Status: DC
  Administered 2015-09-26 – 2015-09-27 (×2): 200 [IU] via ORAL
  Filled 2015-09-25 (×3): qty 1

## 2015-09-25 MED ORDER — METOCLOPRAMIDE HCL 10 MG PO TABS: 5.0000 mg | ORAL_TABLET | Freq: Three times a day (TID) | ORAL | Status: DC | PRN

## 2015-09-25 MED ORDER — HYDROMORPHONE HCL 1 MG/ML IJ SOLN
1.0000 mg | INTRAMUSCULAR | Status: DC | PRN
  Administered 2015-09-25 – 2015-09-26 (×3): 1 mg via INTRAVENOUS
  Filled 2015-09-25 (×3): qty 1

## 2015-09-25 MED ORDER — ACETAMINOPHEN 10 MG/ML IV SOLN
INTRAVENOUS | Status: AC
  Filled 2015-09-25: qty 100

## 2015-09-25 MED ORDER — DIPHENHYDRAMINE HCL 12.5 MG/5ML PO ELIX: 12.5000 mg | ORAL_SOLUTION | ORAL | Status: DC | PRN

## 2015-09-25 MED ORDER — ACETAMINOPHEN 325 MG PO TABS: 650.0000 mg | ORAL_TABLET | Freq: Four times a day (QID) | ORAL | Status: DC | PRN

## 2015-09-25 MED ORDER — METOCLOPRAMIDE HCL 5 MG/ML IJ SOLN: 5.0000 mg | Freq: Three times a day (TID) | INTRAMUSCULAR | Status: DC | PRN

## 2015-09-25 MED ORDER — SIMVASTATIN 40 MG PO TABS
40.0000 mg | ORAL_TABLET | Freq: Every day | ORAL | Status: DC
  Administered 2015-09-25 – 2015-09-26 (×2): 40 mg via ORAL
  Filled 2015-09-25 (×3): qty 1

## 2015-09-25 MED ORDER — PHENOL 1.4 % MT LIQD: 1.0000 | OROMUCOSAL | Status: DC | PRN

## 2015-09-25 MED ORDER — FENTANYL CITRATE (PF) 100 MCG/2ML IJ SOLN
INTRAMUSCULAR | Status: DC | PRN
  Administered 2015-09-25: 100 ug via INTRAVENOUS

## 2015-09-25 MED ORDER — PHENYLEPHRINE HCL 10 MG/ML IJ SOLN
INTRAMUSCULAR | Status: DC | PRN
  Administered 2015-09-25 (×4): 80 ug via INTRAVENOUS

## 2015-09-25 MED ORDER — SODIUM CHLORIDE 0.9 % IV SOLN
INTRAVENOUS | Status: DC
  Administered 2015-09-25: 16:00:00 via INTRAVENOUS

## 2015-09-25 MED ORDER — PROPOFOL 10 MG/ML IV BOLUS
INTRAVENOUS | Status: AC
  Filled 2015-09-25: qty 60

## 2015-09-25 MED ORDER — ONDANSETRON HCL 4 MG/2ML IJ SOLN
INTRAMUSCULAR | Status: AC
  Filled 2015-09-25: qty 2

## 2015-09-25 MED ORDER — ADULT MULTIVITAMIN W/MINERALS CH
1.0000 | ORAL_TABLET | Freq: Every day | ORAL | Status: DC
  Administered 2015-09-26 – 2015-09-27 (×2): 1 via ORAL
  Filled 2015-09-25 (×3): qty 1

## 2015-09-25 MED ORDER — ONDANSETRON HCL 4 MG PO TABS: 4.0000 mg | ORAL_TABLET | Freq: Four times a day (QID) | ORAL | Status: DC | PRN

## 2015-09-25 MED ORDER — ONDANSETRON HCL 4 MG/2ML IJ SOLN
INTRAMUSCULAR | Status: DC | PRN
  Administered 2015-09-25: 4 mg via INTRAVENOUS

## 2015-09-25 MED ORDER — ALUM & MAG HYDROXIDE-SIMETH 200-200-20 MG/5ML PO SUSP: 30.0000 mL | ORAL | Status: DC | PRN

## 2015-09-25 MED ORDER — CEFAZOLIN SODIUM-DEXTROSE 2-3 GM-% IV SOLR
INTRAVENOUS | Status: AC
  Filled 2015-09-25: qty 50

## 2015-09-25 MED ORDER — LACTATED RINGERS IV SOLN
INTRAVENOUS | Status: DC
  Administered 2015-09-25: 13:00:00 via INTRAVENOUS
  Administered 2015-09-25: 1000 mL via INTRAVENOUS
  Administered 2015-09-25: 12:00:00 via INTRAVENOUS

## 2015-09-25 MED ORDER — PROPOFOL 500 MG/50ML IV EMUL
INTRAVENOUS | Status: DC | PRN
  Administered 2015-09-25: 100 ug/kg/min via INTRAVENOUS

## 2015-09-25 MED ORDER — CEFAZOLIN SODIUM 1-5 GM-% IV SOLN
1.0000 g | Freq: Four times a day (QID) | INTRAVENOUS | Status: AC
  Administered 2015-09-25 – 2015-09-26 (×2): 1 g via INTRAVENOUS
  Filled 2015-09-25 (×2): qty 50

## 2015-09-25 MED ORDER — OXYCODONE HCL 5 MG PO TABS
5.0000 mg | ORAL_TABLET | ORAL | Status: DC | PRN
  Administered 2015-09-25: 5 mg via ORAL
  Administered 2015-09-25 – 2015-09-27 (×12): 10 mg via ORAL
  Filled 2015-09-25: qty 1
  Filled 2015-09-25 (×12): qty 2

## 2015-09-25 MED ORDER — METHOCARBAMOL 1000 MG/10ML IJ SOLN
500.0000 mg | Freq: Four times a day (QID) | INTRAMUSCULAR | Status: DC | PRN
  Filled 2015-09-25: qty 5

## 2015-09-25 MED ORDER — DEXAMETHASONE SODIUM PHOSPHATE 10 MG/ML IJ SOLN
INTRAMUSCULAR | Status: DC | PRN
  Administered 2015-09-25: 10 mg via INTRAVENOUS

## 2015-09-25 MED ORDER — HYDROCODONE-ACETAMINOPHEN 7.5-325 MG PO TABS: 1.0000 | ORAL_TABLET | Freq: Once | ORAL | Status: DC | PRN

## 2015-09-25 MED ORDER — SODIUM CHLORIDE 0.9 % IR SOLN
Status: DC | PRN
Start: 2015-09-25 — End: 2015-09-25
  Administered 2015-09-25: 1000 mL

## 2015-09-25 MED ORDER — CEFAZOLIN SODIUM-DEXTROSE 2-4 GM/100ML-% IV SOLN
2.0000 g | INTRAVENOUS | Status: AC
  Administered 2015-09-25: 2 g via INTRAVENOUS

## 2015-09-25 MED ORDER — LEVOTHYROXINE SODIUM 100 MCG PO TABS
100.0000 ug | ORAL_TABLET | Freq: Every day | ORAL | Status: DC
  Administered 2015-09-26 – 2015-09-27 (×2): 100 ug via ORAL
  Filled 2015-09-25 (×3): qty 1

## 2015-09-25 MED ORDER — MIDAZOLAM HCL 5 MG/5ML IJ SOLN
INTRAMUSCULAR | Status: DC | PRN
  Administered 2015-09-25: 2 mg via INTRAVENOUS

## 2015-09-25 MED ORDER — KETOROLAC TROMETHAMINE 30 MG/ML IJ SOLN: 30.0000 mg | Freq: Once | INTRAMUSCULAR | Status: DC

## 2015-09-25 MED ORDER — ZOLPIDEM TARTRATE 5 MG PO TABS
5.0000 mg | ORAL_TABLET | Freq: Every evening | ORAL | Status: DC | PRN
  Administered 2015-09-26: 5 mg via ORAL
  Filled 2015-09-25: qty 1

## 2015-09-25 SURGICAL SUPPLY — 36 items
APL SKNCLS STERI-STRIP NONHPOA (GAUZE/BANDAGES/DRESSINGS) ×1
BAG SPEC THK2 15X12 ZIP CLS (MISCELLANEOUS)
BAG ZIPLOCK 12X15 (MISCELLANEOUS) IMPLANT
BENZOIN TINCTURE PRP APPL 2/3 (GAUZE/BANDAGES/DRESSINGS) ×1 IMPLANT
BLADE SAW SGTL 18X1.27X75 (BLADE) ×2 IMPLANT
CAPT HIP TOTAL 2 ×2 IMPLANT
CELLS DAT CNTRL 66122 CELL SVR (MISCELLANEOUS) ×1 IMPLANT
CLOTH BEACON ORANGE TIMEOUT ST (SAFETY) ×2 IMPLANT
DRAPE STERI IOBAN 125X83 (DRAPES) ×2 IMPLANT
DRAPE U-SHAPE 47X51 STRL (DRAPES) ×4 IMPLANT
DRSG AQUACEL AG ADV 3.5X10 (GAUZE/BANDAGES/DRESSINGS) ×2 IMPLANT
DURAPREP 26ML APPLICATOR (WOUND CARE) ×2 IMPLANT
ELECT REM PT RETURN 9FT ADLT (ELECTROSURGICAL) ×2
ELECTRODE REM PT RTRN 9FT ADLT (ELECTROSURGICAL) ×1 IMPLANT
GAUZE XEROFORM 1X8 LF (GAUZE/BANDAGES/DRESSINGS) IMPLANT
GLOVE BIO SURGEON STRL SZ7.5 (GLOVE) ×2 IMPLANT
GLOVE BIOGEL PI IND STRL 8 (GLOVE) ×2 IMPLANT
GLOVE BIOGEL PI INDICATOR 8 (GLOVE) ×2
GLOVE ECLIPSE 8.0 STRL XLNG CF (GLOVE) ×2 IMPLANT
GOWN STRL REUS W/TWL XL LVL3 (GOWN DISPOSABLE) ×4 IMPLANT
HANDPIECE INTERPULSE COAX TIP (DISPOSABLE) ×2
HOLDER FOLEY CATH W/STRAP (MISCELLANEOUS) ×2 IMPLANT
PACK ANTERIOR HIP CUSTOM (KITS) ×2 IMPLANT
RETRACTOR WND ALEXIS 18 MED (MISCELLANEOUS) ×1 IMPLANT
RTRCTR WOUND ALEXIS 18CM MED (MISCELLANEOUS) ×2
SET HNDPC FAN SPRY TIP SCT (DISPOSABLE) ×1 IMPLANT
STAPLER VISISTAT 35W (STAPLE) IMPLANT
STRIP CLOSURE SKIN 1/2X4 (GAUZE/BANDAGES/DRESSINGS) ×1 IMPLANT
SUT ETHIBOND NAB CT1 #1 30IN (SUTURE) ×2 IMPLANT
SUT MNCRL AB 4-0 PS2 18 (SUTURE) ×2 IMPLANT
SUT VIC AB 0 CT1 36 (SUTURE) ×2 IMPLANT
SUT VIC AB 1 CT1 36 (SUTURE) ×2 IMPLANT
SUT VIC AB 2-0 CT1 27 (SUTURE) ×4
SUT VIC AB 2-0 CT1 TAPERPNT 27 (SUTURE) ×2 IMPLANT
TRAY FOLEY W/METER SILVER 14FR (SET/KITS/TRAYS/PACK) ×2 IMPLANT
TRAY FOLEY W/METER SILVER 16FR (SET/KITS/TRAYS/PACK) ×1 IMPLANT

## 2015-09-25 NOTE — Anesthesia Procedure Notes (Signed)
Spinal Patient location during procedure: OR End time: 09/25/2015 11:51 AM Staffing Resident/CRNA: Enrigue Catena E Performed by: anesthesiologist  Preanesthetic Checklist Completed: patient identified, site marked, surgical consent, pre-op evaluation, timeout performed, IV checked, risks and benefits discussed and monitors and equipment checked Spinal Block Patient position: sitting Prep: Betadine Patient monitoring: heart rate, continuous pulse ox and blood pressure Location: L4-5 Injection technique: single-shot Needle Needle type: Sprotte  Needle gauge: 24 G Needle length: 9 cm Assessment Sensory level: T4 Additional Notes Expiration date of kit checked and confirmed. Patient tolerated procedure well, without complications.

## 2015-09-25 NOTE — Anesthesia Preprocedure Evaluation (Addendum)
Anesthesia Evaluation  Patient identified by MRN, date of birth, ID band Patient awake    Reviewed: Allergy & Precautions, NPO status , Patient's Chart, lab work & pertinent test results  Airway Mallampati: II  TM Distance: >3 FB Neck ROM: Full    Dental  (+) Dental Advisory Given   Pulmonary former smoker,    breath sounds clear to auscultation       Cardiovascular hypertension, Pt. on medications  Rhythm:Regular Rate:Normal     Neuro/Psych negative neurological ROS     GI/Hepatic negative GI ROS, Neg liver ROS,   Endo/Other  Hypothyroidism   Renal/GU negative Renal ROS     Musculoskeletal   Abdominal   Peds  Hematology negative hematology ROS (+)   Anesthesia Other Findings   Reproductive/Obstetrics                            Lab Results  Component Value Date   WBC 5.2 09/18/2015   HGB 12.4 09/18/2015   HCT 36.5 09/18/2015   MCV 86.1 09/18/2015   PLT 269 09/18/2015   Lab Results  Component Value Date   CREATININE 0.70 09/18/2015   BUN 14 09/18/2015   NA 143 09/18/2015   K 4.4 09/18/2015   CL 107 09/18/2015   CO2 29 09/18/2015   No results found for: INR, PROTIME  Anesthesia Physical Anesthesia Plan  ASA: II  Anesthesia Plan: Spinal and MAC   Post-op Pain Management:    Induction: Intravenous  Airway Management Planned: Natural Airway and Simple Face Mask  Additional Equipment:   Intra-op Plan:   Post-operative Plan:   Informed Consent: I have reviewed the patients History and Physical, chart, labs and discussed the procedure including the risks, benefits and alternatives for the proposed anesthesia with the patient or authorized representative who has indicated his/her understanding and acceptance.     Plan Discussed with: CRNA  Anesthesia Plan Comments:        Anesthesia Quick Evaluation

## 2015-09-25 NOTE — Brief Op Note (Signed)
09/25/2015  1:02 PM  PATIENT:  Billee Cashing  60 y.o. female  PRE-OPERATIVE DIAGNOSIS:  Severe osteoarthritis left hip  POST-OPERATIVE DIAGNOSIS:  Severe osteoarthritis left hip  PROCEDURE:  Procedure(s): LEFT TOTAL HIP ARTHROPLASTY ANTERIOR APPROACH (Left)  SURGEON:  Surgeon(s) and Role:    * Mcarthur Rossetti, MD - Primary  PHYSICIAN ASSISTANT: Benita Stabile, PA-C  ANESTHESIA:   spinal  EBL:  Total I/O In: 1000 [I.V.:1000] Out: 150 [Blood:150]  COUNTS:  YES  TOURNIQUET:  * No tourniquets in log *  DICTATION: .Other Dictation: Dictation Number [id number]  PLAN OF CARE: Admit to inpatient   PATIENT DISPOSITION:  PACU - hemodynamically stable.   Delay start of Pharmacological VTE agent (>24hrs) due to surgical blood loss or risk of bleeding: no

## 2015-09-25 NOTE — H&P (Signed)
TOTAL HIP ADMISSION H&P  Patient is admitted for left total hip arthroplasty.  Subjective:  Chief Complaint: left hip pain  HPI: Brittany Ali, 60 y.o. female, has a history of pain and functional disability in the left hip(s) due to arthritis and patient has failed non-surgical conservative treatments for greater than 12 weeks to include NSAID's and/or analgesics, flexibility and strengthening excercises, use of assistive devices, weight reduction as appropriate and activity modification.  Onset of symptoms was gradual starting 2 years ago with gradually worsening course since that time.The patient noted no past surgery on the left hip(s).  Patient currently rates pain in the left hip at 10 out of 10 with activity. Patient has night pain, worsening of pain with activity and weight bearing, trendelenberg gait, pain that interfers with activities of daily living, pain with passive range of motion and crepitus. Patient has evidence of subchondral sclerosis, periarticular osteophytes and joint space narrowing by imaging studies. This condition presents safety issues increasing the risk of falls.  There is no current active infection.  Patient Active Problem List   Diagnosis Date Noted  . Osteoarthritis of left hip 09/25/2015   Past Medical History  Diagnosis Date  . Elevated cholesterol   . Hypertension   . Hx of seasonal allergies     intermittently  . Hypothyroidism   . Cancer ([place])     pre cancer-skin. cervical dysplasia (frozen procedure in office).  . Anemia     during menses- none recent  . Herpes simplex     "generally show up on buttocks" -none at present.    Past Surgical History  Procedure Laterality Date  . Tubal ligation  1996    BTSP  . Bunionectomy Bilateral     bilateral -retained hardware in one foot  . Salpingoophorectomy      ? which side- "tubal pregnancy"    No prescriptions prior to admission   Allergies  Allergen Reactions  . Adhesive [Tape] Hives and Rash     Social History  Substance Use Topics  . Smoking status: Former Smoker -- 2.00 packs/day for 35 years    Types: Cigarettes    Quit date: 09/18/2006  . Smokeless tobacco: Not on file     Comment: quit age 76  . Alcohol Use: 0.0 oz/week    0 Standard drinks or equivalent per week     Comment: occasionally    Family History  Problem Relation Age of Onset  . Thyroid disease Mother     HYPO     Review of Systems  Musculoskeletal: Positive for joint pain.  All other systems reviewed and are negative.   Objective:  Physical Exam  Constitutional: She is oriented to person, place, and time. She appears well-developed and well-nourished.  HENT:  Head: Normocephalic and atraumatic.  Eyes: EOM are normal. Pupils are equal, round, and reactive to light.  Neck: Normal range of motion. Neck supple.  Cardiovascular: Normal rate and regular rhythm.   Respiratory: Effort normal and breath sounds normal.  GI: Soft. Bowel sounds are normal.  Musculoskeletal:       Left hip: She exhibits decreased range of motion, decreased strength, tenderness and bony tenderness.  Neurological: She is alert and oriented to person, place, and time.  Skin: Skin is warm and dry.  Psychiatric: She has a normal mood and affect.    Vital signs in last 24 hours:    Labs:   There is no height or weight on file to calculate BMI.  Imaging Review Plain radiographs demonstrate severe degenerative joint disease of the left hip(s). The bone quality appears to be excellent for age and reported activity level.  Assessment/Plan:  End stage arthritis, left hip(s)  The patient history, physical examination, clinical judgement of the provider and imaging studies are consistent with end stage degenerative joint disease of the left hip(s) and total hip arthroplasty is deemed medically necessary. The treatment options including medical management, injection therapy, arthroscopy and arthroplasty were discussed at  length. The risks and benefits of total hip arthroplasty were presented and reviewed. The risks due to aseptic loosening, infection, stiffness, dislocation/subluxation,  thromboembolic complications and other imponderables were discussed.  The patient acknowledged the explanation, agreed to proceed with the plan and consent was signed. Patient is being admitted for inpatient treatment for surgery, pain control, PT, OT, prophylactic antibiotics, VTE prophylaxis, progressive ambulation and ADL's and discharge planning.The patient is planning to be discharged home with home health services

## 2015-09-25 NOTE — Transfer of Care (Signed)
Immediate Anesthesia Transfer of Care Note  Patient: Brittany Ali  Procedure(s) Performed: Procedure(s): LEFT TOTAL HIP ARTHROPLASTY ANTERIOR APPROACH (Left)  Patient Location: PACU  Anesthesia Type:Spinal  Level of Consciousness: awake, alert , oriented and patient cooperative  Airway & Oxygen Therapy: Patient Spontanous Breathing and Patient connected to face mask oxygen  Post-op Assessment: Report given to RN and Post -op Vital signs reviewed and stable  Post vital signs: stable  Last Vitals:  Filed Vitals:   09/25/15 0950 09/25/15 1322  BP: 149/85 99/69  Pulse: 79   Temp: 36.9 C   Resp: 16     Complications: No apparent anesthesia complications  T 12 spinal level

## 2015-09-25 NOTE — Op Note (Signed)
Brittany Ali, Brittany Ali NO.:  [id number]  MEDICAL RECORD NO.:  [id number]  LOCATION:  [facility]                         FACILITY:  [facility]  PHYSICIAN:  Lind Guest. Ninfa Linden, M.D.DATE OF BIRTH:  03-27-1956  DATE OF PROCEDURE:  09/25/2015 DATE OF DISCHARGE:                              OPERATIVE REPORT   PREOPERATIVE DIAGNOSIS:  Primary osteoarthritis and degenerative joint disease, left hip.  POSTOPERATIVE DIAGNOSIS:  Primary osteoarthritis and degenerative joint disease, left hip.  PROCEDURE:  Left total hip arthroplasty with a direct anterior approach.  IMPLANTS:  DePuy Sector Gription acetabular component size 52, size 36+ 0 polyethylene liner, size 11 Corail femoral component with standard offset, size 36+ 1.5 ceramic hip ball.  SURGEON:  Lind Guest. Ninfa Linden, MD  ASSISTANT:  Erskine Emery, PA-C  ANESTHESIA:  Spinal.  ANTIBIOTICS:  2 g IV Ancef.  BLOOD LOSS:  200-250 mL.  COMPLICATIONS:  None.  INDICATIONS:  Ms. Brittany Ali is a very pleasant 60 year old female with debilitating primary osteoarthritis involving her left hip.  She has tried and failed all forms of conservative treatment.  Her pain is daily and has detrimentally affected her activities of daily living, her quality of life and her mobility.  At this point, she does wish to proceed with total hip arthroplasty through direct anterior approach. Her x-rays show superior lateral joint space wear and loss, periarticular osteophytes, and sclerotic changes.  She understands with surgery, there is risk of acute blood loss anemia, nerve and vessel injury, fracture, infection, dislocation, and DVT.  She understands our goals are decreased pain, improved mobility, and overall improved quality of life.  PROCEDURE DESCRIPTION:  After informed consent was obtained, appropriate left hip was marked.  She was brought to the operating room and spinal anesthesia was obtained while she was on her stretcher.   Traction boots were placed on both of her feet.  A Foley catheter was placed as well. She was then placed supine on the Hana fracture table, the perineal post in place and both legs in inline skeletal traction devices, but no traction applied.  Her left operative hip was then prepped and draped with DuraPrep and sterile drapes.  Time-out was called and she was identified as correct patient, correct left hip.  We then made incision inferior and posterior to the anterior superior iliac spine and carried this obliquely down the leg.  We dissected down the tensor fascia lata muscle and the tensor fascia was then divided longitudinally, so we could proceed with direct anterior approach to the hip.  We identified and cauterized the lateral femoral circumflex vessels and then identified the hip capsule, placed the Cobra retractors around the lateral medial femoral neck.  We then opened up the hip capsule in an L- type format finding a moderate joint effusion.  We placed Cobra retractors within joint capsule and made our femoral neck cut with an oscillating saw proximal to the lesser trochanter and completed this on osteotome.  We placed a corkscrew guide in the femoral head and removed femoral head in its entirety, found it to be significantly devoid of cartilage.  We then cleaned the acetabular remnants, acetabular labrum. I placed a bent  Hohmann over the medial acetabular rim and then began reaming from a size 42 reamer under direct visualization all the way up to a size 52, with again all reamers under direct visualization, and the last reamer also under direct fluoroscopy, so we could obtain our depth of reaming, our inclination, and anteversion.  Once we were pleased with this, we placed the real DePuy Sector Gription acetabular component size 52 and a 36+ 0 polyethylene liner for that size acetabular component. Attention was then turned to the femur.  With the leg externally rotated to  110-120 degrees, extended and adducted, we were able to place a Mueller retractor medially and a Hohmann retractor behind the greater trochanter.  We released the lateral joint capsule and used a box cutting osteotome to enter the femoral canal and a rongeur to lateralize.  We then began broaching from a size 8 broach up to a size 11 broach using the Corail broaching system.  We then trialed a standard offset femoral neck and a 36+ 1.5 hip ball, brought the leg back over and up with traction and internal rotation, we reduced the femoral head into the acetabulum.  We then pleased with leg length, offset, as measured on the fluoroscopy as well as range of motion and stability. We then dislocated the hip, removed the trial components.  We then placed the real Corail femoral component size 11 with standard offset followed by the real 36+ 1.5 ceramic hip ball.  We then brought the leg back up and reducing the pelvis.  We copiously irrigated the soft tissue with normal saline solution using pulsatile lavage.  We were able to close the joint capsule with interrupted #1 Ethibond suture followed by running #1 Vicryl in the tensor fascia, 0 Vicryl in the deep tissue, 2-0 Vicryl in subcutaneous tissue 4-0 Monocryl subcuticular stitch, and an Aquacel dressing was applied.  She was taken off the Hana table and taken to the recovery room in stable condition.  All final counts were correct.  There were no complications noted.  Of note, Erskine Emery, PA- C assisted in the entire case.  His assistance was crucial for facilitating all aspects of this case.     Lind Guest. Ninfa Linden, M.D.     CYB/MEDQ  D:  09/25/2015  T:  09/25/2015  Job:  [id number]

## 2015-09-26 LAB — BASIC METABOLIC PANEL
Anion gap: 8 (ref 5–15)
BUN: 11 mg/dL (ref 6–20)
CO2: 27 mmol/L (ref 22–32)
CREATININE: 0.66 mg/dL (ref 0.44–1.00)
Calcium: 8.9 mg/dL (ref 8.9–10.3)
Chloride: 103 mmol/L (ref 101–111)
GFR calc Af Amer: >60 mL/min (ref >60)
GLUCOSE: 154 mg/dL — AB (ref 65–99)
POTASSIUM: 3.9 mmol/L (ref 3.5–5.1)
Sodium: 138 mmol/L (ref 135–145)

## 2015-09-26 LAB — CBC
HCT: 33 % — ABNORMAL LOW (ref 36.0–46.0)
Hemoglobin: 11.4 g/dL — ABNORMAL LOW (ref 12.0–15.0)
MCH: 29.7 pg (ref 26.0–34.0)
MCHC: 34.5 g/dL (ref 30.0–36.0)
MCV: 85.9 fL (ref 78.0–100.0)
PLATELETS: 251 10*3/uL (ref 150–400)
RBC: 3.84 MIL/uL — AB (ref 3.87–5.11)
RDW: 13.1 % (ref 11.5–15.5)
WBC: 10.7 10*3/uL — ABNORMAL HIGH (ref 4.0–10.5)

## 2015-09-26 MED ORDER — KETOROLAC TROMETHAMINE 15 MG/ML IJ SOLN
7.5000 mg | Freq: Four times a day (QID) | INTRAMUSCULAR | Status: DC
  Administered 2015-09-26 – 2015-09-27 (×3): 7.5 mg via INTRAVENOUS
  Filled 2015-09-26 (×7): qty 1

## 2015-09-26 MED ORDER — OXYCODONE HCL 5 MG PO TABS: 5.0000 mg | ORAL_TABLET | ORAL | Status: DC | PRN

## 2015-09-26 MED ORDER — LISINOPRIL 10 MG PO TABS
10.0000 mg | ORAL_TABLET | Freq: Every day | ORAL | Status: DC
Start: 2015-09-26 — End: 2015-09-27
  Administered 2015-09-26 – 2015-09-27 (×2): 10 mg via ORAL
  Filled 2015-09-26 (×2): qty 1

## 2015-09-26 MED ORDER — ASPIRIN 325 MG PO TBEC: 325.0000 mg | DELAYED_RELEASE_TABLET | Freq: Two times a day (BID) | ORAL | Status: AC

## 2015-09-26 MED ORDER — METHOCARBAMOL 500 MG PO TABS: 500.0000 mg | ORAL_TABLET | Freq: Four times a day (QID) | ORAL | Status: AC | PRN

## 2015-09-26 NOTE — Evaluation (Signed)
Occupational Therapy Evaluation Patient Details Name: LAMIYAH RIEDY MRN: [id number] DOB: 03-15-1956 Today's Date: October 04, 2015    History of Present Illness L THA   Clinical Impression   Pt is s/p THA resulting in the deficits listed below (see OT Problem List).  Pt will benefit from skilled OT to increase their safety and independence with ADL and functional mobility for ADL to facilitate discharge to venue listed below.        Follow Up Recommendations  No OT follow up    Equipment Recommendations  3 in 1 bedside comode       Precautions / Restrictions Precautions Precautions: None Restrictions Weight Bearing Restrictions: No      Mobility Bed Mobility               General bed mobility comments: pt in chair  Transfers Overall transfer level: Needs assistance Equipment used: Rolling walker (2 wheeled) Transfers: Sit to/from Stand;Stand Pivot Transfers Sit to Stand: Min assist         General transfer comment: Vc for safety and sequencing         ADL Overall ADL's : Needs assistance/impaired                 Upper Body Dressing : Set up;Standing   Lower Body Dressing: Maximal assistance;Sit to/from stand;Cueing for sequencing;Cueing for safety   Toilet Transfer: RW Toilet Transfer Details (indicate cue type and reason): sit to stand only Toileting- Clothing Manipulation and Hygiene: Minimal assistance;Sit to/from stand         General ADL Comments: pts sister will A as needed                        Extremity/Trunk Assessment Upper Extremity Assessment Upper Extremity Assessment: Overall WFL for tasks assessed           Communication Communication Communication: No difficulties   Cognition Arousal/Alertness: Awake/alert Behavior During Therapy: WFL for tasks assessed/performed Overall Cognitive Status: Within Functional Limits for tasks assessed                                Home Living Family/patient  expects to be discharged to:: Private residence Living Arrangements: Spouse/significant other Available Help at Discharge: Family Type of Home: House Home Access: Stairs to enter           [employer] Shower/Tub: Occupational psychologist: Standard                     OT Diagnosis: Generalized weakness   OT Problem List: Decreased strength;Decreased knowledge of use of DME or AE   OT Treatment/Interventions: Self-care/ADL training;Patient/family education;DME and/or AE instruction    OT Goals(Current goals can be found in the care plan section) Acute Rehab OT Goals Patient Stated Goal: home and back to activity OT Goal Formulation: With patient Time For Goal Achievement: 10/10/15 Potential to Achieve Goals: Good  OT Frequency:                End of Session Nurse Communication: Mobility status  Activity Tolerance: Patient tolerated treatment well Patient left: in chair;with call bell/phone within reach   Time: 1015-1040 OT Time Calculation (min): 25 min Charges:  OT General Charges $OT Visit: 1 Procedure OT Evaluation $OT Eval Low Complexity: 1 Procedure OT Treatments $Self Care/Home Management : 8-22 mins G-Codes:    Payton Mccallum D October 04, 2015, 10:50  AM    

## 2015-09-26 NOTE — Evaluation (Signed)
Physical Therapy Evaluation Patient Details Name: Brittany Ali MRN: [id number] DOB: [birth year] Today's Date: 09/26/2015   History of Present Illness  L THA  Clinical Impression  Pt s/p L THR presents with decreased L LE strength/ROM and post op pain limiting functional mobility.  Pt should progress to dc home with family assist and HHPT follow up    Follow Up Recommendations Home health PT    Equipment Recommendations  Rolling walker with 5" wheels    Recommendations for Other Services OT consult     Precautions / Restrictions Precautions Precautions: None Restrictions Weight Bearing Restrictions: No Other Position/Activity Restrictions: WBAT      Mobility  Bed Mobility Overal bed mobility: Needs Assistance Bed Mobility: Supine to Sit     Supine to sit: Min assist     General bed mobility comments: pt in chair  Transfers Overall transfer level: Needs assistance Equipment used: Rolling walker (2 wheeled) Transfers: Sit to/from [facility] Sit to Stand: Min assist         General transfer comment: Vc for safety and sequencing  Ambulation/Gait Ambulation/Gait assistance: Min assist Ambulation Distance (Feet): 74 Feet Assistive device: Rolling walker (2 wheeled) Gait Pattern/deviations: Step-to pattern;Decreased step length - right;Decreased step length - left;Shuffle;Trunk flexed Gait velocity: decr Gait velocity interpretation: Below normal speed for age/gender General Gait Details: Increased time with cues for posture, sequence and position from [employer]            Wheelchair Mobility    Modified Rankin (Stroke Patients Only)       Balance                                             Pertinent Vitals/Pain Pain Assessment: 0-10 Pain Score: 5  Pain Location: L hip/thigh Pain Descriptors / Indicators: Aching;Sore Pain Intervention(s): Limited activity within patient's tolerance;Monitored during  session;Premedicated before session;Ice applied    Home Living Family/patient expects to be discharged to:: Private residence Living Arrangements: Spouse/significant other Available Help at Discharge: Family Type of Home: House Home Access: Stairs to enter     Home Layout: Two level Home Equipment: [place] - single point;Crutches      Prior Function Level of Independence: Independent               Hand Dominance        Extremity/Trunk Assessment   Upper Extremity Assessment: Overall WFL for tasks assessed           Lower Extremity Assessment: LLE deficits/detail   LLE Deficits / Details: Strength at hip 2+/5 with AAROM at hip to 90 flex and 20 abd  Cervical / Trunk Assessment: Normal  Communication   Communication: No difficulties  Cognition Arousal/Alertness: Awake/alert Behavior During Therapy: WFL for tasks assessed/performed Overall Cognitive Status: Within Functional Limits for tasks assessed                      General Comments      Exercises Total Joint Exercises Ankle Circles/Pumps: AROM;Both;15 reps;Supine Quad Sets: AROM;Both;10 reps;Supine Heel Slides: AAROM;20 reps;Supine;Left Hip ABduction/ADduction: AAROM;Left;15 reps;Supine      Assessment/Plan    PT Assessment Patient needs continued PT services  PT Diagnosis Difficulty walking   PT Problem List Decreased strength;Decreased range of motion;Decreased activity tolerance;Decreased knowledge of use of DME;Decreased mobility;Pain  PT Treatment Interventions DME instruction;Gait training;Stair  training;Functional mobility training;Therapeutic activities;Therapeutic exercise;Patient/family education   PT Goals (Current goals can be found in the Care Plan section) Acute Rehab PT Goals Patient Stated Goal: home and back to activity PT Goal Formulation: With patient Time For Goal Achievement: 09/30/15 Potential to Achieve Goals: Good    Frequency 7X/week   Barriers to discharge         Co-evaluation               End of Session Equipment Utilized During Treatment: Gait belt Activity Tolerance: Patient tolerated treatment well Patient left: in chair;with call bell/phone within reach Nurse Communication: Mobility status         Time: [id number] PT Time Calculation (min) (ACUTE ONLY): 39 min   Charges:   PT Evaluation $PT Eval Low Complexity: 1 Procedure PT Treatments $Gait Training: 8-22 mins $Therapeutic Exercise: 8-22 mins   PT G Codes:        Brittany Ali 10-01-2015, 1:03 PM

## 2015-09-26 NOTE — Evaluation (Signed)
Physical Therapy Evaluation Patient Details Name: Brittany Ali MRN: [id number] DOB: [birth year] Today's Date: 09/26/2015   History of Present Illness  L THA  Clinical Impression  Pt very motivated, progressing with mobility and hopeful for dc tomorrow.    Follow Up Recommendations Home health PT    Equipment Recommendations  Rolling walker with 5" wheels    Recommendations for Other Services OT consult     Precautions / Restrictions Precautions Precautions: None Restrictions Weight Bearing Restrictions: No Other Position/Activity Restrictions: WBAT      Mobility  Bed Mobility Overal bed mobility: Needs Assistance Bed Mobility: Supine to Sit;Sit to Supine     Supine to sit: Min assist Sit to supine: Min assist   General bed mobility comments: cues for sequence and use of R LE to self assist  Transfers Overall transfer level: Needs assistance Equipment used: Rolling walker (2 wheeled) Transfers: Sit to/from Stand Sit to Stand: Min guard         General transfer comment: Vc for safety and sequencing  Ambulation/Gait Ambulation/Gait assistance: Min assist;Min guard Ambulation Distance (Feet): 160 Feet Assistive device: Rolling walker (2 wheeled) Gait Pattern/deviations: Step-to pattern;Step-through pattern;Decreased step length - right;Decreased step length - left;Shuffle;Trunk flexed     General Gait Details: cues for posture, sequence and position from [employer]            Wheelchair Mobility    Modified Rankin (Stroke Patients Only)       Balance                                             Pertinent Vitals/Pain Pain Assessment: 0-10 Pain Score: 5  Pain Location: L hip/thigh Pain Descriptors / Indicators: Aching;Sore Pain Intervention(s): Premedicated before session;Monitored during session;Limited activity within patient's tolerance;Ice applied    Home Living                        Prior Function                  Hand Dominance        Extremity/Trunk Assessment                         Communication      Cognition Arousal/Alertness: Awake/alert Behavior During Therapy: WFL for tasks assessed/performed Overall Cognitive Status: Within Functional Limits for tasks assessed                      General Comments      Exercises        Assessment/Plan    PT Assessment    PT Diagnosis     PT Problem List    PT Treatment Interventions     PT Goals (Current goals can be found in the Care Plan section) Acute Rehab PT Goals Patient Stated Goal: home and back to activity PT Goal Formulation: With patient Time For Goal Achievement: 09/30/15 Potential to Achieve Goals: Good    Frequency 7X/week   Barriers to discharge        Co-evaluation               End of Session Equipment Utilized During Treatment: Gait belt Activity Tolerance: Patient tolerated treatment well Patient left: in bed;with call bell/phone within reach Nurse Communication: Mobility status  Time: [id number] PT Time Calculation (min) (ACUTE ONLY): 23 min   Charges:     PT Treatments $Gait Training: 23-37 mins   PT G Codes:        [name] 26-Oct-2015, 3:36 PM

## 2015-09-26 NOTE — Discharge Instructions (Signed)

## 2015-09-26 NOTE — Progress Notes (Signed)
Subjective: 1 Day Post-Op Procedure(s) (LRB): LEFT TOTAL HIP ARTHROPLASTY ANTERIOR APPROACH (Left) Patient reports pain as moderate.  Had a painful night, but tolerating things better this am.  Has been up with therapy.  Objective: Vital signs in last 24 hours: Temp:  [97.3 F (36.3 C)-98.7 F (37.1 C)] 98.7 F (37.1 C) (04/01 0445) Pulse Rate:  [55-86] 86 (04/01 0445) Resp:  [10-17] 16 (04/01 0445) BP: (99-149)/(68-92) 140/68 mmHg (04/01 0445) SpO2:  [96 %-100 %] 97 % (04/01 0445) Weight:  [93.895 kg (207 lb)] 93.895 kg (207 lb) (03/31 1002)  Intake/Output from previous day: 03/31 0701 - 04/01 0700 In: 3698.8 [P.O.:240; I.V.:3358.8; IV Piggyback:100] Out: 3125 [Urine:2975; Blood:150] Intake/Output this shift: Total I/O In: 240 [P.O.:240] Out: -    Recent Labs  09/26/15 0418  HGB 11.4*    Recent Labs  09/26/15 0418  WBC 10.7*  RBC 3.84*  HCT 33.0*  PLT 251    Recent Labs  09/26/15 0418  NA 138  K 3.9  CL 103  CO2 27  BUN 11  CREATININE 0.66  GLUCOSE 154*  CALCIUM 8.9   No results for input(s): LABPT, INR in the last 72 hours.  Sensation intact distally Intact pulses distally Dorsiflexion/Plantar flexion intact Incision: dressing C/D/I  Assessment/Plan: 1 Day Post-Op Procedure(s) (LRB): LEFT TOTAL HIP ARTHROPLASTY ANTERIOR APPROACH (Left) Up with therapy Discharge home with home health tomorrow vs Monday.  BLACKMAN,CHRISTOPHER Y 09/26/2015, 9:39 AM

## 2015-09-27 NOTE — Progress Notes (Signed)
RN reviewed discharge instructions with patient and family. All questions answered.  Paperwork and prescriptions given.   NT rolled patient down in wheelchair to family car.  

## 2015-09-27 NOTE — Progress Notes (Signed)
Occupational Therapy Treatment Patient Details Name: THURSA BRANNUM MRN: [id number] DOB: [birth year] Today's Date: 09/27/2015    History of present illness L THA-direct anterior                Precautions / Restrictions Precautions Precautions: None Restrictions Weight Bearing Restrictions: No LLE Weight Bearing: Weight bearing as tolerated       Mobility Bed Mobility Overal bed mobility: Needs Assistance Bed Mobility: Supine to Sit     Supine to sit: Min assist     General bed mobility comments: pt in chair  Transfers Overall transfer level: Needs assistance Equipment used: Rolling walker (2 wheeled) Transfers: Sit to/from [facility] Sit to Stand: Supervision Stand pivot transfers: Supervision       General transfer comment: close guard for safety.         ADL                       Lower Body Dressing: Min guard;Sit to/from stand;Cueing for sequencing;Cueing for safety   Toilet Transfer: RW   Toileting- Clothing Manipulation and Hygiene: Supervision/safety;Sit to/from stand;Cueing for sequencing;Cueing for safety   Tub/ Shower Transfer: Supervision/safety;Walk-in shower   Functional mobility during ADLs: Supervision/safety;Cueing for safety                  Cognition   Behavior During Therapy: WFL for tasks assessed/performed Overall Cognitive Status: Within Functional Limits for tasks assessed                                    Pertinent Vitals/ Pain       Pain Assessment: 0-10 Pain Score: 3  Pain Location: L thigh area Pain Descriptors / Indicators: Sore;Discomfort Pain Intervention(s): Monitored during session;Repositioned;Ice applied         Frequency       Progress Toward Goals  OT Goals(current goals can now be found in the care plan section)  Progress towards OT goals: Progressing toward goals     Plan Discharge plan remains appropriate       End of Session     Activity Tolerance  Patient tolerated treatment well   Patient Left in chair;with family/visitor present   Nurse Communication Mobility status        Time: [id number] OT Time Calculation (min): 12 min  Charges: OT General Charges $OT Visit: 1 Procedure OT Treatments $Self Care/Home Management : 8-22 mins  Merl Guardino, Thereasa Parkin 09/27/2015, 12:33 PM

## 2015-09-27 NOTE — Progress Notes (Signed)
Physical Therapy Treatment Patient Details Name: Brittany Ali MRN: [id number] DOB: 1956-02-05 Today's Date: 09/27/2015    History of Present Illness L THA-direct anterior    PT Comments    Progressing well with mobility. Pain rated 4/10 during session. Pt reports she may d/c home later today (she would like to.) Will have a 2nd session to practice stair negotiation. Recommend HHPT follow up.   Follow Up Recommendations  Home health PT     Equipment Recommendations  Rolling walker with 5" wheels    Recommendations for Other Services       Precautions / Restrictions Precautions Precautions: None Restrictions Weight Bearing Restrictions: No LLE Weight Bearing: Weight bearing as tolerated    Mobility  Bed Mobility Overal bed mobility: Needs Assistance Bed Mobility: Supine to Sit     Supine to sit: Min assist     General bed mobility comments: small amount of assist for L LE. Increased time.  Transfers Overall transfer level: Needs assistance Equipment used: Rolling walker (2 wheeled) Transfers: Sit to/from Stand Sit to Stand: Min guard         General transfer comment: close guard for safety.   Ambulation/Gait Ambulation/Gait assistance: Min guard Ambulation Distance (Feet): 150 Feet Assistive device: Rolling walker (2 wheeled) Gait Pattern/deviations: Step-to pattern;Step-through pattern     General Gait Details: close guard for safety. Pt beginning to use reciprocal gait pattern more. slow gait speed.    Stairs            Wheelchair Mobility    Modified Rankin (Stroke Patients Only)       Balance                                    Cognition Arousal/Alertness: Awake/alert Behavior During Therapy: WFL for tasks assessed/performed Overall Cognitive Status: Within Functional Limits for tasks assessed                      Exercises Total Joint Exercises Ankle Circles/Pumps: AROM;Both;15 reps;Supine Quad Sets:  AROM;Both;10 reps;Supine Heel Slides: AAROM;Left;10 reps;Supine Hip ABduction/ADduction: AAROM;Left;10 reps;Supine Long Arc Quad: AROM;Left;Seated    General Comments        Pertinent Vitals/Pain Pain Assessment: 0-10 Pain Score: 3  Pain Location: L thigh area Pain Descriptors / Indicators: Sore;Discomfort Pain Intervention(s): Monitored during session;Repositioned;Ice applied    Home Living                      Prior Function            PT Goals (current goals can now be found in the care plan section) Progress towards PT goals: Progressing toward goals    Frequency  7X/week    PT Plan Current plan remains appropriate    Co-evaluation             End of Session Equipment Utilized During Treatment: Gait belt Activity Tolerance: Patient tolerated treatment well Patient left: in chair;with call bell/phone within reach     Time: 0923-0943 PT Time Calculation (min) (ACUTE ONLY): 20 min  Charges:  $Gait Training: 8-22 mins                    G Codes:      Weston Anna, MPT Pager: [phone]

## 2015-09-27 NOTE — Discharge Summary (Signed)
Physician Discharge Summary  Patient ID: BAYA PRESSLER MRN: [id number] DOB/AGE: 1956-03-28 60 y.o.  Admit date: 09/25/2015 Discharge date: 09/27/2015  Admission Diagnoses: Osteoarthritis left hip  Discharge Diagnoses:  Principal Problem:   Osteoarthritis of left hip Active Problems:   Status post total replacement of left hip   Discharged Condition: stable  Hospital Course: Patient's hospital course was essentially unremarkable. She underwent left total hip arthroplasty. Postoperatively she progressed well and was discharged to home in stable condition.  Consults: None  Significant Diagnostic Studies: labs: Routine labs  Treatments: surgery: See operative note  Discharge Exam: Blood pressure 134/70, pulse 79, temperature 98.3 F (36.8 C), temperature source Oral, resp. rate 16, height 5\' 9"  (1.753 m), weight 93.895 kg (207 lb), SpO2 97 %. Incision/Wound: dressing clean and dry  Disposition: 01-Home or Self Care     Medication List    STOP taking these medications        ibuprofen 200 MG tablet  Commonly known as:  ADVIL,MOTRIN     traMADol 50 MG tablet  Commonly known as:  ULTRAM      TAKE these medications        acetaminophen 500 MG tablet  Commonly known as:  TYLENOL  Take 500-1,000 mg by mouth every 6 (six) hours as needed for mild pain.     ALLEREST NO DROWSINESS PO  Take 1 tablet by mouth.     aspirin 325 MG EC tablet  Take 1 tablet (325 mg total) by mouth 2 (two) times daily after a meal.     cholecalciferol 1000 units tablet  Commonly known as:  VITAMIN D  Take 200 Units by mouth daily.     levothyroxine 100 MCG tablet  Commonly known as:  SYNTHROID, LEVOTHROID  Take 100 mcg by mouth daily.     lisinopril 10 MG tablet  Commonly known as:  PRINIVIL,ZESTRIL  Take 10 mg by mouth daily.     Menthol (Topical Analgesic) 5 % Pads  Apply 1 patch topically daily as needed (for pain).     BLUE-EMU MAXIMUM STRENGTH EX  Apply 1 application topically  daily as needed (for pain).     methocarbamol 500 MG tablet  Commonly known as:  ROBAXIN  Take 1 tablet (500 mg total) by mouth every 6 (six) hours as needed. For spasms     multivitamin with minerals Tabs tablet  Take 1 tablet by mouth daily.     oxyCODONE 5 MG immediate release tablet  Commonly known as:  Oxy IR/ROXICODONE  Take 1-2 tablets (5-10 mg total) by mouth every 4 (four) hours as needed for severe pain or breakthrough pain.     simvastatin 40 MG tablet  Commonly known as:  ZOCOR  Take 40 mg by mouth at bedtime.     valACYclovir 1000 MG tablet  Commonly known as:  VALTREX  Take 1 tablet by mouth daily as needed. For outbreaks ( 5 days then stop)           Follow-up Information    Follow up with Mcarthur Rossetti, MD In 2 weeks.   Specialty:  Orthopedic Surgery   Contact information:   [place] [address] [phone]       Signed: Newt Minion 09/27/2015, 12:12 PM

## 2015-09-27 NOTE — Care Management Note (Signed)
Case Management Note  Patient Details  Name: Brittany Ali MRN: [id number] Date of Birth: 1956-06-05  Subjective/Objective:   Status post total replacement of left hip                 Action/Plan: Discharge Planning: AVS reviewed:    NCM spoke to pt at bedside. Offered choice for HH/provided [facility] list. Pt agreeable to [facility] for HH. Contacted [place] liaison for [facility] PT for scheduled dc today. [place] DME rep delivered RW and 3n1 to room. Husband and sister will be at home to assist with her care.    Expected Discharge Date:  09/27/2015             Expected Discharge Plan:  [place]  In-House Referral:  NA  Discharge planning Services  CM Consult  Post Acute Care Choice:  Home Health Choice offered to:  Patient  DME Arranged:  3-N-1, Walker rolling DME Agency:  [place]:  PT [place]:  [place]  Status of Service:  Completed, signed off  Medicare Important Message Given:    Date Medicare IM Given:    Medicare IM give by:    Date Additional Medicare IM Given:    Additional Medicare Important Message give by:     If discussed at [place] of Stay Meetings, dates discussed:    Additional Comments:  Erenest Rasher, RN 09/27/2015, 1:10 PM

## 2015-09-27 NOTE — Progress Notes (Signed)
Physical Therapy Treatment Patient Details Name: Brittany Ali MRN: [id number] DOB: 08-19-1955 Today's Date: 09/27/2015    History of Present Illness L THA-direct anterior    PT Comments    2nd session to practice steps. All education completed.   Follow Up Recommendations  Home health PT     Equipment Recommendations  Rolling walker with 5" wheels    Recommendations for Other Services       Precautions / Restrictions Precautions Precautions: None Restrictions Weight Bearing Restrictions: No LLE Weight Bearing: Weight bearing as tolerated    Mobility  Bed Mobility               General bed mobility comments: pt in chair  Transfers Overall transfer level: Needs assistance Equipment used: Rolling walker (2 wheeled) Transfers: Sit to/from Stand Sit to Stand: Supervision Stand pivot transfers: Supervision       General transfer comment: for safety  Ambulation/Gait Ambulation/Gait assistance: Supervision Ambulation Distance (Feet): 75 Feet Assistive device: Rolling walker (2 wheeled) Gait Pattern/deviations: Step-to pattern;Step-through pattern     General Gait Details: for safety.    Stairs   Stairs assistance: Min guard Stair Management: Step to pattern;Forwards;Two rails Number of Stairs: 2 (x2) General stair comments: VCs safety, sequence, technique. close guard for safety  Wheelchair Mobility    Modified Rankin (Stroke Patients Only)       Balance                                    Cognition Arousal/Alertness: Awake/alert Behavior During Therapy: WFL for tasks assessed/performed Overall Cognitive Status: Within Functional Limits for tasks assessed                      Exercises      General Comments        Pertinent Vitals/Pain Pain Assessment: 0-10 Pain Score: 3  Pain Location: L thigh area Pain Descriptors / Indicators: Sore;Discomfort Pain Intervention(s): Monitored during session;Repositioned     Home Living                      Prior Function            PT Goals (current goals can now be found in the care plan section) Progress towards PT goals: Progressing toward goals    Frequency  7X/week    PT Plan Current plan remains appropriate    Co-evaluation             End of Session   Activity Tolerance: Patient tolerated treatment well Patient left: in chair;with call bell/phone within reach;with family/visitor present     Time: 1331-1340 PT Time Calculation (min) (ACUTE ONLY): 9 min  Charges:  $Gait Training: 8-22 mins                    G Codes:      Weston Anna, MPT Pager: [phone]

## 2015-09-27 NOTE — Progress Notes (Signed)
Utilization review complete. Dametrius Sanjuan RN CCM Case Mgmt phone [phone] 

## 2015-09-28 ENCOUNTER — Encounter (HOSPITAL_COMMUNITY): Payer: Self-pay | Admitting: Orthopaedic Surgery

## 2015-09-30 NOTE — Anesthesia Postprocedure Evaluation (Signed)
Anesthesia Post Note  Patient: Brittany Ali  Procedure(s) Performed: Procedure(s) (LRB): LEFT TOTAL HIP ARTHROPLASTY ANTERIOR APPROACH (Left)  Patient location during evaluation: PACU Anesthesia Type: Spinal and MAC Level of consciousness: awake and alert Pain management: pain level controlled Vital Signs Assessment: post-procedure vital signs reviewed and stable Respiratory status: spontaneous breathing and respiratory function stable Cardiovascular status: blood pressure returned to baseline and stable Postop Assessment: spinal receding Anesthetic complications: no    Last Vitals:  Filed Vitals:   09/26/15 2049 09/27/15 0500  BP: 114/59 134/70  Pulse: 83 79  Temp: 36.9 C 36.8 C  Resp: 16 16    Last Pain:  Filed Vitals:   09/27/15 1332  PainSc: 2                  Tiajuana Amass

## 2016-09-05 ENCOUNTER — Ambulatory Visit (INDEPENDENT_AMBULATORY_CARE_PROVIDER_SITE_OTHER): Payer: 59 | Admitting: Orthopaedic Surgery

## 2016-09-06 ENCOUNTER — Other Ambulatory Visit: Payer: Self-pay | Admitting: Family Medicine

## 2016-09-06 DIAGNOSIS — Z1231 Encounter for screening mammogram for malignant neoplasm of breast: Secondary | ICD-10-CM

## 2016-09-20 DIAGNOSIS — I10 Essential (primary) hypertension: Secondary | ICD-10-CM | POA: Diagnosis not present

## 2016-09-20 DIAGNOSIS — E039 Hypothyroidism, unspecified: Secondary | ICD-10-CM | POA: Diagnosis not present

## 2016-09-20 DIAGNOSIS — Z0000 Encounter for general adult medical examination without abnormal findings: Secondary | ICD-10-CM | POA: Diagnosis not present

## 2016-09-20 DIAGNOSIS — E559 Vitamin D deficiency, unspecified: Secondary | ICD-10-CM | POA: Diagnosis not present

## 2016-09-21 ENCOUNTER — Ambulatory Visit (INDEPENDENT_AMBULATORY_CARE_PROVIDER_SITE_OTHER): Payer: Commercial Managed Care - HMO

## 2016-09-21 ENCOUNTER — Ambulatory Visit (INDEPENDENT_AMBULATORY_CARE_PROVIDER_SITE_OTHER): Payer: Commercial Managed Care - HMO | Admitting: Orthopaedic Surgery

## 2016-09-21 DIAGNOSIS — Z96642 Presence of left artificial hip joint: Secondary | ICD-10-CM | POA: Diagnosis not present

## 2016-09-21 NOTE — Progress Notes (Signed)
Office Visit Note   Patient: Brittany Ali           Date of Birth: Jul 18, 1955           MRN: [id number] Visit Date: 09/21/2016              Requested by: Mayra Neer, MD [address] [place], [place] [address] PCP: Mayra Neer, MD   Assessment & Plan: Visit Diagnoses:  1. History of left hip replacement   2. Status post total replacement of left hip     Plan: She is doing great at 1 year postop. She has no issues at all. I talked her in length about the things that would need to bring her back for that left hip. We can always see her for anything else she is any issues at all. All questions were encouraged and answered.  Follow-Up Instructions: Return if symptoms worsen or fail to improve.   Orders:  Orders Placed This Encounter  Procedures  . XR HIP UNILAT W OR W/O PELVIS 1V LEFT   No orders of the defined types were placed in this encounter.     Procedures: No procedures performed   Clinical Data: No additional findings.   Subjective: No chief complaint on file. The patient is 1 year status post a left total hip arthroplasty through direct anterior approach. She's had a 1 year follow-up. She has no complaints of her left hip at all.  HPI  Review of Systems She denies any chest pain, headache, short breath, fever, chills, nausea, vomiting.  Objective: Vital Signs: There were no vitals taken for this visit.  Physical Exam He is alert and oriented 3 and in no acute distress. She walks without a limp. She is not using any type of assistive device. Ortho Exam On examination of her left hip she has fluid and full active and passive range of motion left hip no difficulty at all. She is neurovascularly intact. Her leg lengths are equal. Specialty Comments:  No specialty comments available.  Imaging: Xr Hip Unilat W Or W/o Pelvis 1v Left  Result Date: 09/21/2016 An AP pelvis and lateral of her left hip shows well seated implant with no  complicating features. I see no evidence of loosening. There is no cortical irregularities or acute changes but    PMFS History: Patient Active Problem List   Diagnosis Date Noted  . Osteoarthritis of left hip 09/25/2015  . Status post total replacement of left hip 09/25/2015   Past Medical History:  Diagnosis Date  . Anemia    during menses- none recent  . Cancer ([place])    pre cancer-skin. cervical dysplasia (frozen procedure in office).  . Elevated cholesterol   . Herpes simplex    "generally show up on buttocks" -none at present.  Marland Kitchen Hx of seasonal allergies    intermittently  . Hypertension   . Hypothyroidism     Family History  Problem Relation Age of Onset  . Thyroid disease Mother     HYPO    Past Surgical History:  Procedure Laterality Date  . BUNIONECTOMY Bilateral    bilateral -retained hardware in one foot  . SALPINGOOPHORECTOMY     ? which side- "tubal pregnancy"  . TOTAL HIP ARTHROPLASTY Left 09/25/2015   Procedure: LEFT TOTAL HIP ARTHROPLASTY ANTERIOR APPROACH;  Surgeon: Mcarthur Rossetti, MD;  Location: [facility];  Service: Orthopedics;  Laterality: Left;  . [place]   BTSP   Social  History   Occupational History  . Not on file.   Social History Main Topics  . Smoking status: Former Smoker    Packs/day: 2.00    Years: 35.00    Types: Cigarettes    Quit date: 09/18/2006  . Smokeless tobacco: Not on file     Comment: quit age 60  . Alcohol use 0.0 oz/week     Comment: occasionally  . Drug use: No  . Sexual activity: Yes

## 2016-09-21 NOTE — Progress Notes (Deleted)
   Office Visit Note   Patient: Brittany Ali           Date of Birth: 04-11-1956           MRN: [id number] Visit Date: 09/21/2016              Requested by: Mayra Neer, MD [address] [place], [place] [address] PCP: Mayra Neer, MD   Assessment & Plan: Visit Diagnoses:  1. History of left hip replacement   2. Status post total replacement of left hip     Plan: ***  Follow-Up Instructions: Return if symptoms worsen or fail to improve.   Orders:  Orders Placed This Encounter  Procedures  . XR HIP UNILAT W OR W/O PELVIS 1V LEFT   No orders of the defined types were placed in this encounter.     Procedures: No procedures performed   Clinical Data: No additional findings.   Subjective: No chief complaint on file.   HPI  Review of Systems   Objective: Vital Signs: There were no vitals taken for this visit.  Physical Exam  Ortho Exam  Specialty Comments:  No specialty comments available.  Imaging: No results found.   PMFS History: Patient Active Problem List   Diagnosis Date Noted  . Osteoarthritis of left hip 09/25/2015  . Status post total replacement of left hip 09/25/2015   Past Medical History:  Diagnosis Date  . Anemia    during menses- none recent  . Cancer ([place])    pre cancer-skin. cervical dysplasia (frozen procedure in office).  . Elevated cholesterol   . Herpes simplex    "generally show up on buttocks" -none at present.  Marland Kitchen Hx of seasonal allergies    intermittently  . Hypertension   . Hypothyroidism     Family History  Problem Relation Age of Onset  . Thyroid disease Mother     HYPO    Past Surgical History:  Procedure Laterality Date  . BUNIONECTOMY Bilateral    bilateral -retained hardware in one foot  . SALPINGOOPHORECTOMY     ? which side- "tubal pregnancy"  . TOTAL HIP ARTHROPLASTY Left 09/25/2015   Procedure: LEFT TOTAL HIP ARTHROPLASTY ANTERIOR APPROACH;  Surgeon: Mcarthur Rossetti, MD;   Location: [facility];  Service: Orthopedics;  Laterality: Left;  . TUBAL LIGATION  1996   BTSP   Social History   Occupational History  . Not on file.   Social History Main Topics  . Smoking status: Former Smoker    Packs/day: 2.00    Years: 35.00    Types: Cigarettes    Quit date: 09/18/2006  . Smokeless tobacco: Not on file     Comment: quit age 12  . Alcohol use 0.0 oz/week     Comment: occasionally  . Drug use: No  . Sexual activity: Yes

## 2016-09-28 ENCOUNTER — Ambulatory Visit
Admission: RE | Admit: 2016-09-28 | Discharge: 2016-09-28 | Disposition: A | Discharge status: Home / Self Care | Payer: 59 | Source: Ambulatory Visit | Attending: Family Medicine | Admitting: Family Medicine

## 2016-09-28 DIAGNOSIS — Z1231 Encounter for screening mammogram for malignant neoplasm of breast: Secondary | ICD-10-CM

## 2016-10-05 DIAGNOSIS — Z1211 Encounter for screening for malignant neoplasm of colon: Secondary | ICD-10-CM | POA: Diagnosis not present

## 2017-01-14 DIAGNOSIS — L02412 Cutaneous abscess of left axilla: Secondary | ICD-10-CM | POA: Diagnosis not present

## 2017-03-16 DIAGNOSIS — L820 Inflamed seborrheic keratosis: Secondary | ICD-10-CM | POA: Diagnosis not present

## 2017-03-16 DIAGNOSIS — L732 Hidradenitis suppurativa: Secondary | ICD-10-CM | POA: Diagnosis not present

## 2017-04-19 ENCOUNTER — Telehealth (INDEPENDENT_AMBULATORY_CARE_PROVIDER_SITE_OTHER): Payer: Self-pay | Admitting: Orthopaedic Surgery

## 2017-04-19 NOTE — Telephone Encounter (Signed)
Patient called advised she is going to the dentist this morning and asked if a note can be faxed over stating she does not need an antibiotic prior to the procedure. The fax # is [phone]  The ph# is [phone]   Dr. Lendon Ka. Dorothyann Peng     The phone number to contact patient is [phone] or [phone] Cell#

## 2017-04-19 NOTE — Telephone Encounter (Signed)
Faxed note to provided fax number.

## 2017-10-12 DIAGNOSIS — Z0000 Encounter for general adult medical examination without abnormal findings: Secondary | ICD-10-CM | POA: Diagnosis not present

## 2017-10-12 DIAGNOSIS — E039 Hypothyroidism, unspecified: Secondary | ICD-10-CM | POA: Diagnosis not present

## 2017-10-12 DIAGNOSIS — I10 Essential (primary) hypertension: Secondary | ICD-10-CM | POA: Diagnosis not present

## 2017-10-12 DIAGNOSIS — E559 Vitamin D deficiency, unspecified: Secondary | ICD-10-CM | POA: Diagnosis not present

## 2017-10-12 DIAGNOSIS — Z23 Encounter for immunization: Secondary | ICD-10-CM | POA: Diagnosis not present

## 2017-10-12 DIAGNOSIS — R319 Hematuria, unspecified: Secondary | ICD-10-CM | POA: Diagnosis not present

## 2017-10-17 ENCOUNTER — Encounter: Payer: Self-pay | Admitting: Internal Medicine

## 2017-11-08 DIAGNOSIS — R3121 Asymptomatic microscopic hematuria: Secondary | ICD-10-CM | POA: Diagnosis not present

## 2017-11-13 DIAGNOSIS — R3129 Other microscopic hematuria: Secondary | ICD-10-CM | POA: Diagnosis not present

## 2017-11-13 DIAGNOSIS — R3121 Asymptomatic microscopic hematuria: Secondary | ICD-10-CM | POA: Diagnosis not present

## 2017-11-14 ENCOUNTER — Other Ambulatory Visit: Payer: Self-pay | Admitting: Family Medicine

## 2017-11-14 DIAGNOSIS — Z1231 Encounter for screening mammogram for malignant neoplasm of breast: Secondary | ICD-10-CM

## 2017-12-08 ENCOUNTER — Ambulatory Visit
Admission: RE | Admit: 2017-12-08 | Discharge: 2017-12-08 | Disposition: A | Discharge status: Home / Self Care | Payer: 59 | Source: Ambulatory Visit | Attending: Family Medicine | Admitting: Family Medicine

## 2017-12-08 DIAGNOSIS — Z1231 Encounter for screening mammogram for malignant neoplasm of breast: Secondary | ICD-10-CM

## 2017-12-12 ENCOUNTER — Ambulatory Visit (AMBULATORY_SURGERY_CENTER): Payer: Self-pay

## 2017-12-12 VITALS — Ht 69.0 in | Wt 194.8 lb

## 2017-12-12 DIAGNOSIS — Z8371 Family history of colonic polyps: Secondary | ICD-10-CM

## 2017-12-12 NOTE — Progress Notes (Signed)
Per pt, no allergies to soy or egg products.Pt not taking any weight loss meds or using  O2 at home.  Pt refused emmi video. 

## 2017-12-13 DIAGNOSIS — Z23 Encounter for immunization: Secondary | ICD-10-CM | POA: Diagnosis not present

## 2017-12-20 ENCOUNTER — Encounter: Payer: Self-pay | Admitting: *Deleted

## 2017-12-26 ENCOUNTER — Other Ambulatory Visit: Payer: Self-pay

## 2017-12-26 ENCOUNTER — Ambulatory Visit (AMBULATORY_SURGERY_CENTER): Payer: 59 | Admitting: Internal Medicine

## 2017-12-26 ENCOUNTER — Encounter: Payer: Self-pay | Admitting: Internal Medicine

## 2017-12-26 VITALS — BP 130/72 | HR 55 | Temp 98.0°F | Resp 12 | Ht 69.0 in | Wt 194.0 lb

## 2017-12-26 DIAGNOSIS — Z1211 Encounter for screening for malignant neoplasm of colon: Secondary | ICD-10-CM | POA: Diagnosis not present

## 2017-12-26 DIAGNOSIS — K635 Polyp of colon: Secondary | ICD-10-CM | POA: Diagnosis not present

## 2017-12-26 DIAGNOSIS — Z860100 Personal history of colon polyps, unspecified: Secondary | ICD-10-CM

## 2017-12-26 DIAGNOSIS — D122 Benign neoplasm of ascending colon: Secondary | ICD-10-CM

## 2017-12-26 HISTORY — DX: Personal history of colonic polyps: Z86.010

## 2017-12-26 HISTORY — DX: Personal history of colon polyps, unspecified: Z86.0100

## 2017-12-26 MED ORDER — SODIUM CHLORIDE 0.9 % IV SOLN: 500.0000 mL | Freq: Once | INTRAVENOUS | Status: AC

## 2017-12-26 NOTE — Progress Notes (Signed)
Called to room to assist during endoscopic procedure.  Patient ID and intended procedure confirmed with present staff. Received instructions for my participation in the procedure from the performing physician.  

## 2017-12-26 NOTE — Patient Instructions (Addendum)
So I did find one polyp and removed it.  I will let you know pathology results and when to have another routine colonoscopy by mail and/or My Chart.  You do have diverticulosis - thickened muscle rings and pouches in the colon wall. Please read the handout about this condition.  I appreciate the opportunity to care for you. Gatha Mayer, MD, [facility]  Handouts given:  Diverticulosis and Polyps  YOU HAD AN ENDOSCOPIC PROCEDURE TODAY AT [facility]:   Refer to the procedure report that was given to you for any specific questions about what was found during the examination.  If the procedure report does not answer your questions, please call your gastroenterologist to clarify.  If you requested that your care partner not be given the details of your procedure findings, then the procedure report has been included in a sealed envelope for you to review at your convenience later.  YOU SHOULD EXPECT: Some feelings of bloating in the abdomen. Passage of more gas than usual.  Walking can help get rid of the air that was put into your GI tract during the procedure and reduce the bloating. If you had a lower endoscopy (such as a colonoscopy or flexible sigmoidoscopy) you may notice spotting of blood in your stool or on the toilet paper. If you underwent a bowel prep for your procedure, you may not have a normal bowel movement for a few days.  Please Note:  You might notice some irritation and congestion in your nose or some drainage.  This is from the oxygen used during your procedure.  There is no need for concern and it should clear up in a day or so.  SYMPTOMS TO REPORT IMMEDIATELY:   Following lower endoscopy (colonoscopy or flexible sigmoidoscopy):  Excessive amounts of blood in the stool  Significant tenderness or worsening of abdominal pains  Swelling of the abdomen that is new, acute  Fever of 100F or higher    For urgent or emergent issues, a gastroenterologist can  be reached at any hour by calling [phone].   DIET:  We do recommend a small meal at first, but then you may proceed to your regular diet.  Drink plenty of fluids but you should avoid alcoholic beverages for 24 hours.  ACTIVITY:  You should plan to take it easy for the rest of today and you should NOT DRIVE or use heavy machinery until tomorrow (because of the sedation medicines used during the test).    FOLLOW UP: Our staff will call the number listed on your records the next business day following your procedure to check on you and address any questions or concerns that you may have regarding the information given to you following your procedure. If we do not reach you, we will leave a message.  However, if you are feeling well and you are not experiencing any problems, there is no need to return our call.  We will assume that you have returned to your regular daily activities without incident.  If any biopsies were taken you will be contacted by phone or by letter within the next 1-3 weeks.  Please call us at [phone] if you have not heard about the biopsies in 3 weeks.    SIGNATURES/CONFIDENTIALITY: You and/or your care partner have signed paperwork which will be entered into your electronic medical record.  These signatures attest to the fact that that the information above on your After Visit Summary has been reviewed  and is understood.  Full responsibility of the confidentiality of this discharge information lies with you and/or your care-partner. 

## 2017-12-26 NOTE — Progress Notes (Signed)
Report to PACU, RN, vss, BBS= Clear.  

## 2017-12-26 NOTE — Op Note (Signed)
[facility] Patient Name: Brittany Ali Procedure Date: 12/26/2017 8:18 AM MRN: [id number] Endoscopist: Gatha Mayer , MD Age: 62 Referring MD:  Date of Birth: 1955-10-13 Gender: Female Account #: [id number] Procedure:                Colonoscopy Indications:              Screening for colorectal malignant neoplasm, Last                            colonoscopy: 2009 Medicines:                Propofol per Anesthesia, Monitored Anesthesia Care Procedure:                Pre-Anesthesia Assessment:                           - Prior to the procedure, a History and Physical                            was performed, and patient medications and                            allergies were reviewed. The patient's tolerance of                            previous anesthesia was also reviewed. The risks                            and benefits of the procedure and the sedation                            options and risks were discussed with the patient.                            All questions were answered, and informed consent                            was obtained. Prior Anticoagulants: The patient has                            taken no previous anticoagulant or antiplatelet                            agents. ASA Grade Assessment: II - A patient with                            mild systemic disease. After reviewing the risks                            and benefits, the patient was deemed in                            satisfactory condition to undergo the procedure.  After obtaining informed consent, the colonoscope                            was passed under direct vision. Throughout the                            procedure, the patient's blood pressure, pulse, and                            oxygen saturations were monitored continuously. The                            Colonoscope was introduced through the anus and                            advanced to the the  terminal ileum, with                            identification of the appendiceal orifice and IC                            valve. The colonoscopy was performed without                            difficulty. The patient tolerated the procedure                            well. The quality of the bowel preparation was                            good. The terminal ileum, ileocecal valve,                            appendiceal orifice, and rectum were photographed.                            The bowel preparation used was Miralax. Scope In: 8:30:22 AM Scope Out: 8:44:36 AM Scope Withdrawal Time: 0 hours 11 minutes 5 seconds  Total Procedure Duration: 0 hours 14 minutes 14 seconds  Findings:                 The perianal and digital rectal examinations were                            normal.                           A diminutive polyp was found in the ascending                            colon. The polyp was sessile. The polyp was removed                            with a cold snare. Resection and retrieval were  complete. Verification of patient identification                            for the specimen was done. Estimated blood loss was                            minimal.                           Multiple diverticula were found in the sigmoid                            colon.                           The exam was otherwise without abnormality on                            direct and retroflexion views. Complications:            No immediate complications. Estimated Blood Loss:     Estimated blood loss was minimal. Impression:               - One diminutive polyp in the ascending colon,                            removed with a cold snare. Resected and retrieved.                           - Diverticulosis in the sigmoid colon.                           - The examination was otherwise normal on direct                            and retroflexion  views. Recommendation:           - Patient has a contact number available for                            emergencies. The signs and symptoms of potential                            delayed complications were discussed with the                            patient. Return to normal activities tomorrow.                            Written discharge instructions were provided to the                            patient.                           - Resume previous diet.                           -  Continue present medications.                           - Repeat colonoscopy is recommended. The                            colonoscopy date will be determined after pathology                            results from today's exam become available for                            review. Gatha Mayer, MD 12/26/2017 8:49:51 AM This report has been signed electronically.

## 2017-12-27 ENCOUNTER — Telehealth: Payer: Self-pay

## 2017-12-27 NOTE — Telephone Encounter (Signed)
  Follow up Call-  Call back number 12/26/2017  Post procedure Call Back phone  # [phone]  Permission to leave phone message Yes  Some recent data might be hidden     Patient questions:  Do you have a fever, pain , or abdominal swelling? No. Pain Score  0 *  Have you tolerated food without any problems? Yes.    Have you been able to return to your normal activities? Yes.    Do you have any questions about your discharge instructions: Diet   No. Medications  No. Follow up visit  No.  Do you have questions or concerns about your Care? Yes.    Actions: * If pain score is 4 or above: No action needed, pain <4.

## 2018-01-05 ENCOUNTER — Encounter: Payer: Self-pay | Admitting: Internal Medicine

## 2018-08-03 DIAGNOSIS — R350 Frequency of micturition: Secondary | ICD-10-CM | POA: Diagnosis not present

## 2018-08-03 DIAGNOSIS — M549 Dorsalgia, unspecified: Secondary | ICD-10-CM | POA: Diagnosis not present

## 2018-11-14 DIAGNOSIS — Z0000 Encounter for general adult medical examination without abnormal findings: Secondary | ICD-10-CM | POA: Diagnosis not present

## 2019-01-29 ENCOUNTER — Telehealth: Payer: Self-pay

## 2019-01-29 NOTE — Telephone Encounter (Signed)
Sent note to provided number

## 2019-01-29 NOTE — Telephone Encounter (Signed)
Tammy at [facility] [place] office would like a note faxed stating that patient does not require pre-dental antibiotics prior to procedure.  Fax# is [phone].  Cb# is [phone].  Please advise.  Thank you.

## 2019-03-05 ENCOUNTER — Other Ambulatory Visit: Payer: Self-pay

## 2019-03-05 DIAGNOSIS — Z20822 Contact with and (suspected) exposure to covid-19: Secondary | ICD-10-CM

## 2019-03-07 LAB — NOVEL CORONAVIRUS, NAA: SARS-CoV-2, NAA: NOT DETECTED

## 2019-12-06 ENCOUNTER — Other Ambulatory Visit (HOSPITAL_COMMUNITY)
Admission: RE | Admit: 2019-12-06 | Discharge: 2019-12-06 | Disposition: A | Discharge status: Home / Self Care | Payer: 59 | Source: Ambulatory Visit | Attending: Family Medicine | Admitting: Family Medicine

## 2019-12-06 ENCOUNTER — Other Ambulatory Visit: Payer: Self-pay | Admitting: Family Medicine

## 2019-12-06 DIAGNOSIS — Z124 Encounter for screening for malignant neoplasm of cervix: Secondary | ICD-10-CM | POA: Insufficient documentation

## 2019-12-10 LAB — CYTOLOGY - PAP
Comment: NEGATIVE
Diagnosis: NEGATIVE
High risk HPV: NEGATIVE

## 2019-12-11 ENCOUNTER — Other Ambulatory Visit: Payer: Self-pay | Admitting: Family Medicine

## 2019-12-11 DIAGNOSIS — Z1231 Encounter for screening mammogram for malignant neoplasm of breast: Secondary | ICD-10-CM

## 2020-01-01 ENCOUNTER — Other Ambulatory Visit: Payer: Self-pay

## 2020-01-01 ENCOUNTER — Ambulatory Visit
Admission: RE | Admit: 2020-01-01 | Discharge: 2020-01-01 | Disposition: A | Discharge status: Home / Self Care | Payer: 59 | Source: Ambulatory Visit | Attending: Family Medicine | Admitting: Family Medicine

## 2020-01-01 DIAGNOSIS — Z1231 Encounter for screening mammogram for malignant neoplasm of breast: Secondary | ICD-10-CM

## 2020-06-08 DIAGNOSIS — I10 Essential (primary) hypertension: Secondary | ICD-10-CM | POA: Diagnosis not present

## 2020-06-08 DIAGNOSIS — E782 Mixed hyperlipidemia: Secondary | ICD-10-CM | POA: Diagnosis not present

## 2020-06-08 DIAGNOSIS — R7301 Impaired fasting glucose: Secondary | ICD-10-CM | POA: Diagnosis not present

## 2020-06-22 DIAGNOSIS — Z23 Encounter for immunization: Secondary | ICD-10-CM | POA: Diagnosis not present

## 2020-09-07 DIAGNOSIS — Z720 Tobacco use: Secondary | ICD-10-CM | POA: Diagnosis not present

## 2020-11-02 DIAGNOSIS — L814 Other melanin hyperpigmentation: Secondary | ICD-10-CM | POA: Diagnosis not present

## 2020-11-02 DIAGNOSIS — D225 Melanocytic nevi of trunk: Secondary | ICD-10-CM | POA: Diagnosis not present

## 2020-11-02 DIAGNOSIS — D1801 Hemangioma of skin and subcutaneous tissue: Secondary | ICD-10-CM | POA: Diagnosis not present

## 2020-11-02 DIAGNOSIS — D2239 Melanocytic nevi of other parts of face: Secondary | ICD-10-CM | POA: Diagnosis not present

## 2020-11-18 DIAGNOSIS — Z20822 Contact with and (suspected) exposure to covid-19: Secondary | ICD-10-CM | POA: Diagnosis not present

## 2020-12-14 ENCOUNTER — Other Ambulatory Visit: Payer: Self-pay | Admitting: Family Medicine

## 2020-12-14 DIAGNOSIS — Z1231 Encounter for screening mammogram for malignant neoplasm of breast: Secondary | ICD-10-CM

## 2020-12-18 DIAGNOSIS — I10 Essential (primary) hypertension: Secondary | ICD-10-CM | POA: Diagnosis not present

## 2020-12-18 DIAGNOSIS — R7303 Prediabetes: Secondary | ICD-10-CM | POA: Diagnosis not present

## 2020-12-18 DIAGNOSIS — E559 Vitamin D deficiency, unspecified: Secondary | ICD-10-CM | POA: Diagnosis not present

## 2020-12-18 DIAGNOSIS — E782 Mixed hyperlipidemia: Secondary | ICD-10-CM | POA: Diagnosis not present

## 2020-12-18 DIAGNOSIS — E039 Hypothyroidism, unspecified: Secondary | ICD-10-CM | POA: Diagnosis not present

## 2020-12-18 DIAGNOSIS — Z0000 Encounter for general adult medical examination without abnormal findings: Secondary | ICD-10-CM | POA: Diagnosis not present

## 2021-01-05 ENCOUNTER — Other Ambulatory Visit: Payer: Self-pay

## 2021-01-05 ENCOUNTER — Ambulatory Visit
Admission: RE | Admit: 2021-01-05 | Discharge: 2021-01-05 | Disposition: A | Discharge status: Home / Self Care | Payer: BC Managed Care – PPO | Source: Ambulatory Visit | Attending: Family Medicine | Admitting: Family Medicine

## 2021-01-05 DIAGNOSIS — Z1231 Encounter for screening mammogram for malignant neoplasm of breast: Secondary | ICD-10-CM

## 2021-01-11 DIAGNOSIS — Z720 Tobacco use: Secondary | ICD-10-CM | POA: Diagnosis not present

## 2021-03-22 DIAGNOSIS — K644 Residual hemorrhoidal skin tags: Secondary | ICD-10-CM | POA: Diagnosis not present

## 2021-05-17 DIAGNOSIS — E7800 Pure hypercholesterolemia, unspecified: Secondary | ICD-10-CM | POA: Diagnosis not present

## 2021-09-04 DIAGNOSIS — R300 Dysuria: Secondary | ICD-10-CM | POA: Diagnosis not present

## 2021-09-13 IMAGING — MG MM DIGITAL SCREENING BILAT W/ TOMO AND CAD
6 of 10 series · 6 of 30 positions shown · non-contrast
Comparison: Previous exam(s).

CLINICAL DATA: Screening.

EXAM:
DIGITAL SCREENING BILATERAL MAMMOGRAM WITH TOMOSYNTHESIS AND CAD
TECHNIQUE: Bilateral screening digital craniocaudal and mediolateral oblique
mammograms were obtained. Bilateral screening digital breast
tomosynthesis was performed. The images were evaluated with
computer-aided detection.

[R MLO synth-2D]
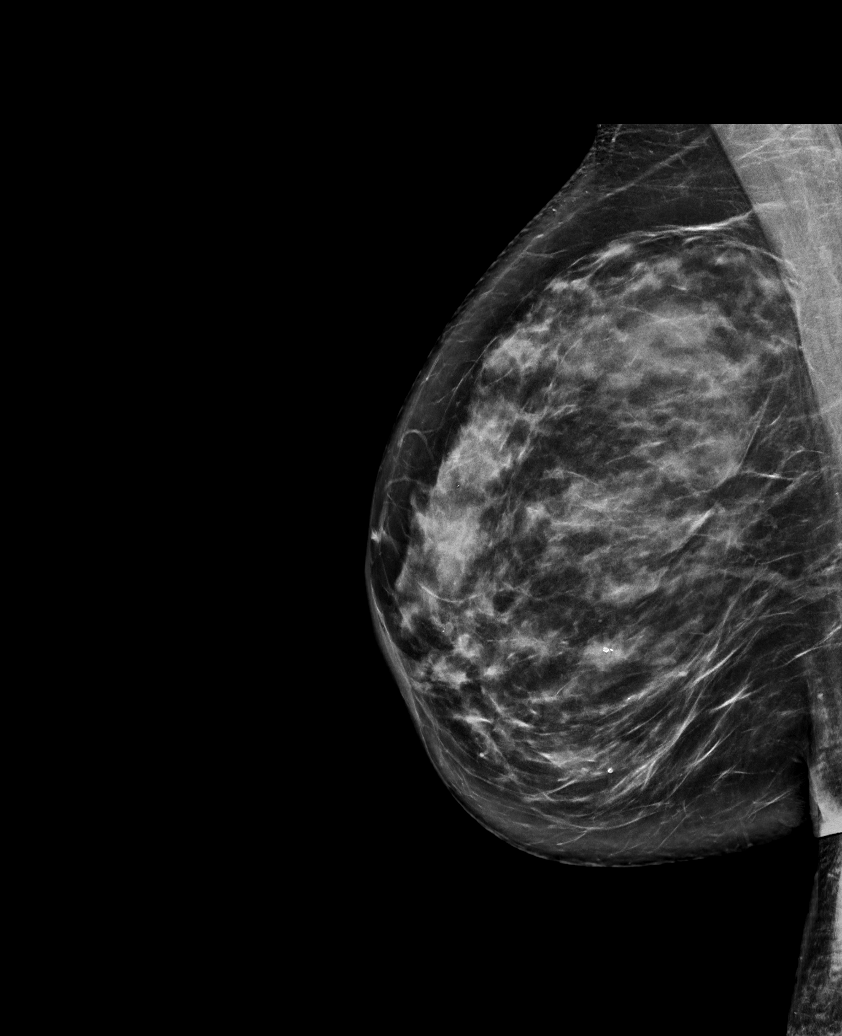

[R CC synth-2D]
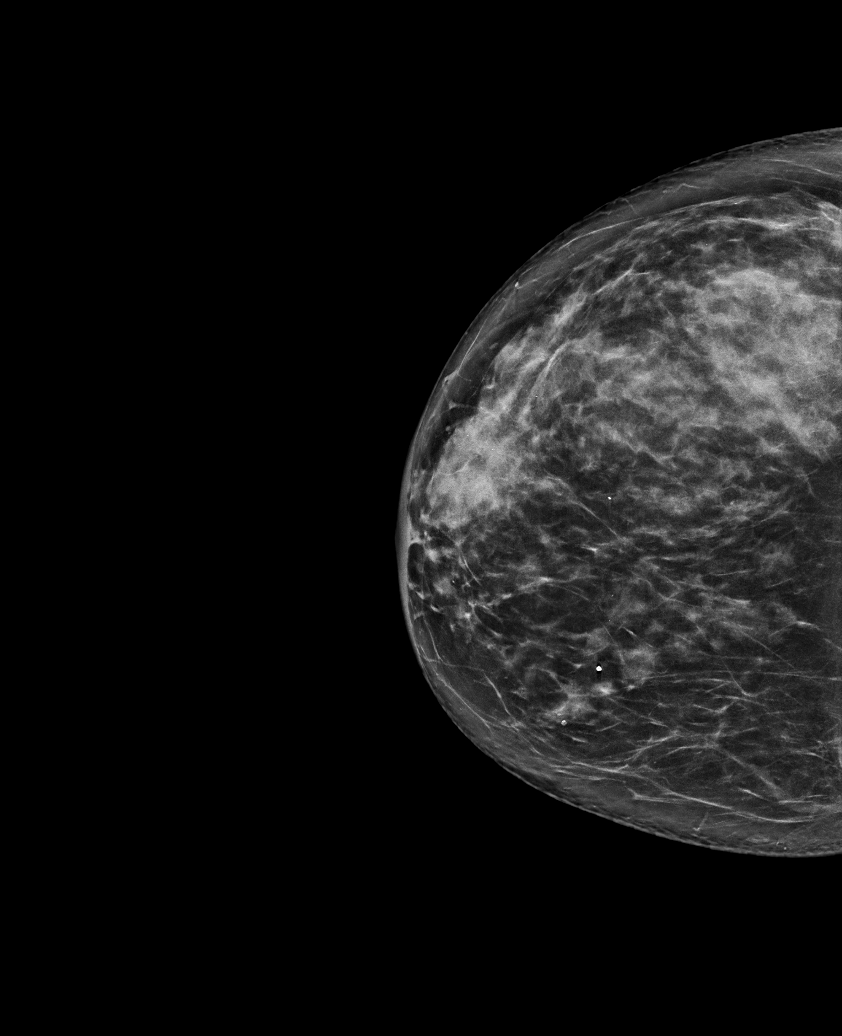

[L CC synth-2D (1 of 2)]
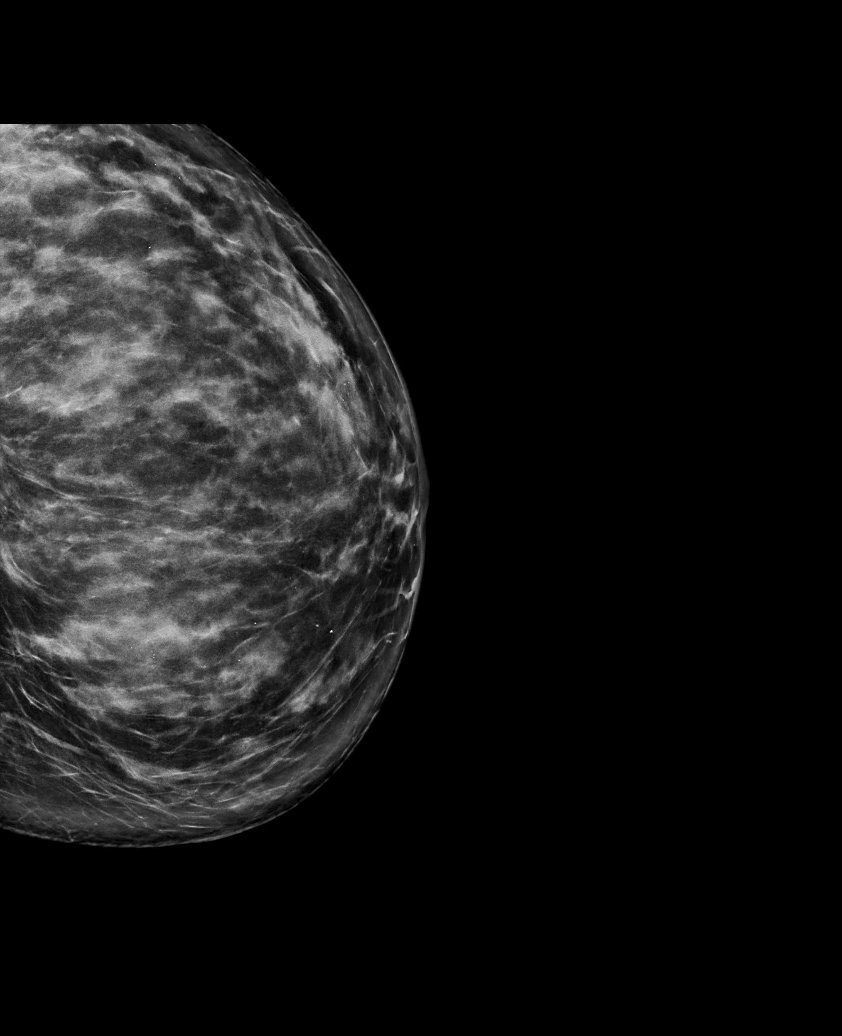

[L CC synth-2D (2 of 2)]
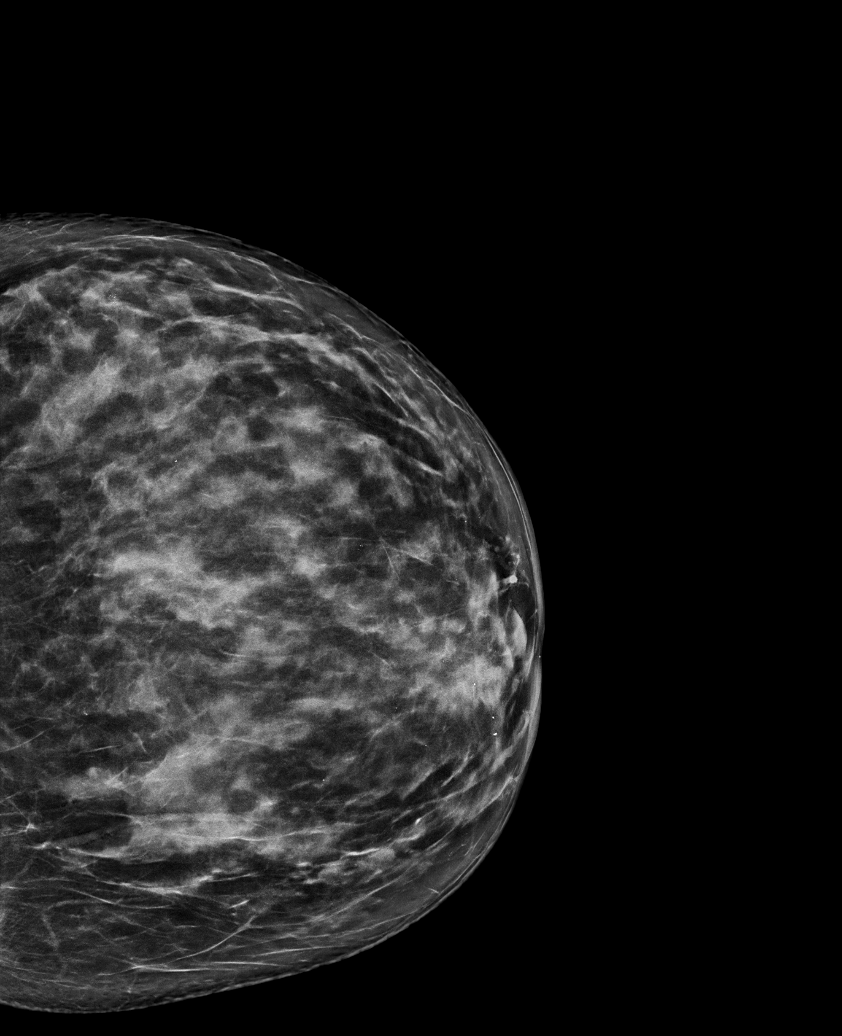

[L MLO synth-2D]
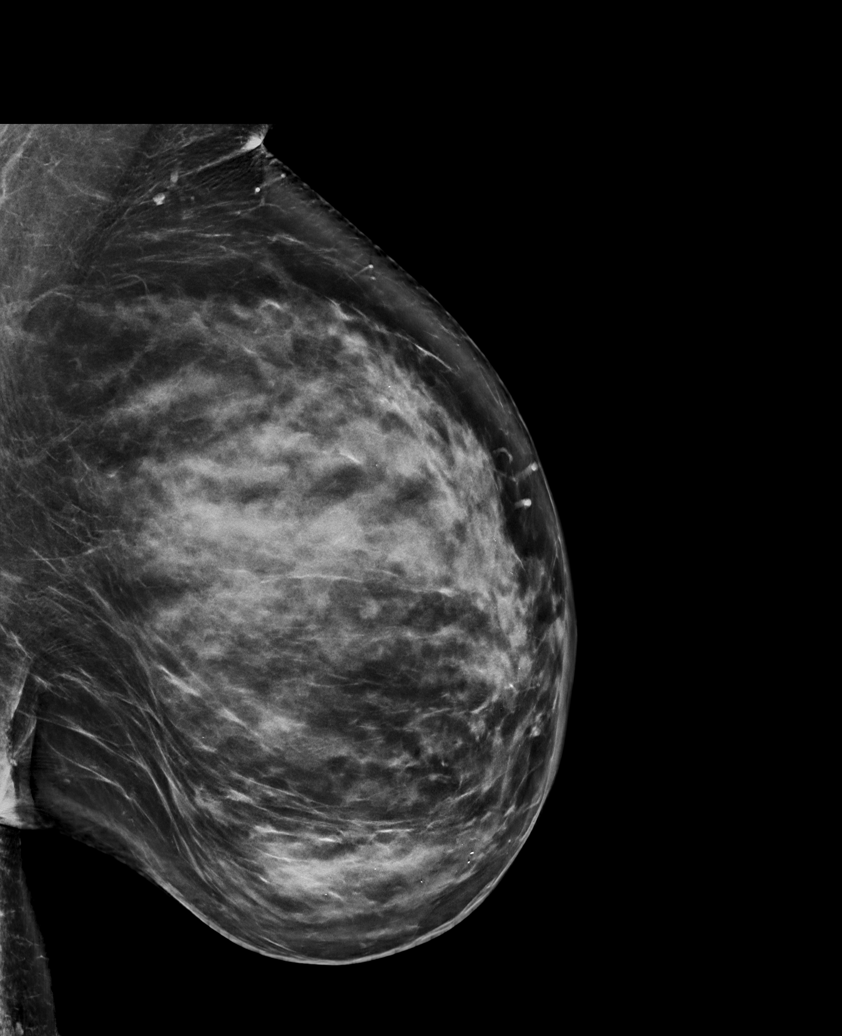

[R CC tomo · tomo slice 39/76.0]
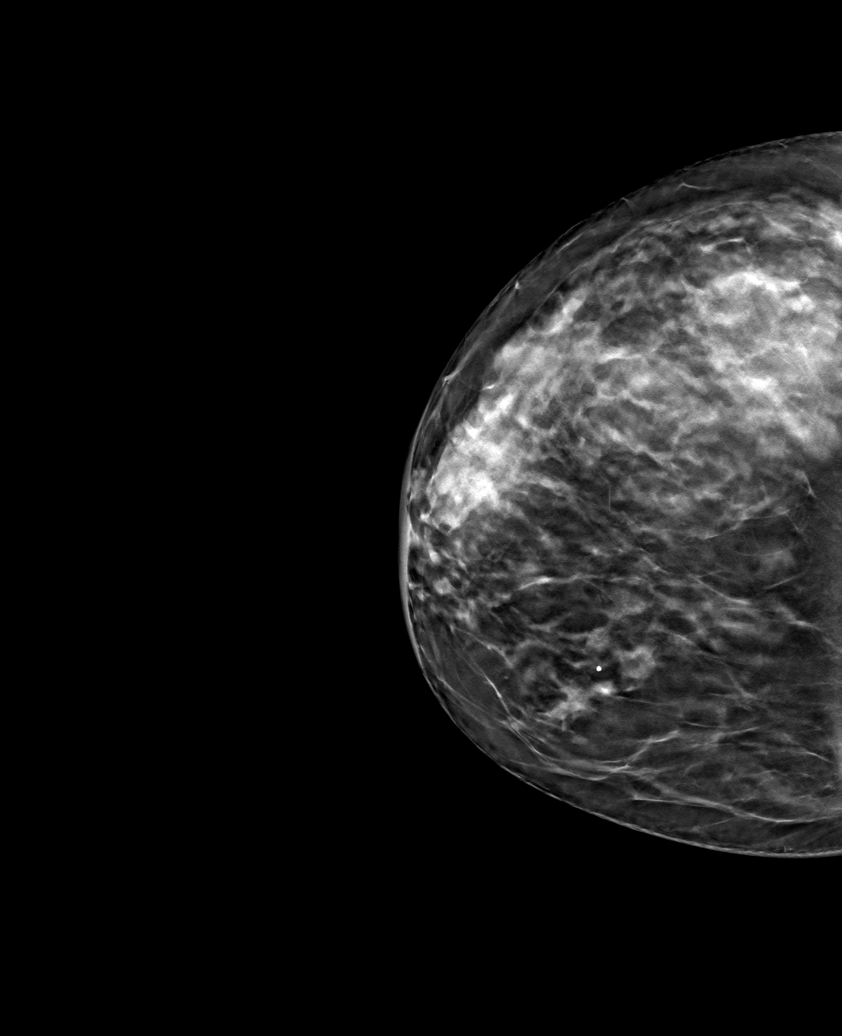

[6 of 30 positions shown; findings below may reference images not displayed]

ACR Breast Density Category c: The breast tissue is heterogeneously
dense, which may obscure small masses.
FINDINGS: There are no findings suspicious for malignancy.
IMPRESSION: No mammographic evidence of malignancy. A result letter of this
screening mammogram will be mailed directly to the patient.

RECOMMENDATION:
Screening mammogram in one year. (Code:[id number]-BC3)

BI-RADS CATEGORY  1: Negative.

## 2021-10-07 DIAGNOSIS — Z720 Tobacco use: Secondary | ICD-10-CM | POA: Diagnosis not present

## 2021-12-20 DIAGNOSIS — D492 Neoplasm of unspecified behavior of bone, soft tissue, and skin: Secondary | ICD-10-CM | POA: Diagnosis not present

## 2021-12-24 DIAGNOSIS — E782 Mixed hyperlipidemia: Secondary | ICD-10-CM | POA: Diagnosis not present

## 2021-12-24 DIAGNOSIS — E039 Hypothyroidism, unspecified: Secondary | ICD-10-CM | POA: Diagnosis not present

## 2021-12-24 DIAGNOSIS — Z23 Encounter for immunization: Secondary | ICD-10-CM | POA: Diagnosis not present

## 2021-12-24 DIAGNOSIS — R7303 Prediabetes: Secondary | ICD-10-CM | POA: Diagnosis not present

## 2021-12-24 DIAGNOSIS — Z0000 Encounter for general adult medical examination without abnormal findings: Secondary | ICD-10-CM | POA: Diagnosis not present

## 2021-12-24 DIAGNOSIS — I10 Essential (primary) hypertension: Secondary | ICD-10-CM | POA: Diagnosis not present

## 2021-12-27 DIAGNOSIS — D492 Neoplasm of unspecified behavior of bone, soft tissue, and skin: Secondary | ICD-10-CM | POA: Diagnosis not present

## 2021-12-27 DIAGNOSIS — D3132 Benign neoplasm of left choroid: Secondary | ICD-10-CM | POA: Diagnosis not present

## 2021-12-27 DIAGNOSIS — H04123 Dry eye syndrome of bilateral lacrimal glands: Secondary | ICD-10-CM | POA: Diagnosis not present

## 2021-12-27 DIAGNOSIS — H25813 Combined forms of age-related cataract, bilateral: Secondary | ICD-10-CM | POA: Diagnosis not present

## 2021-12-27 DIAGNOSIS — H524 Presbyopia: Secondary | ICD-10-CM | POA: Diagnosis not present

## 2022-01-10 ENCOUNTER — Other Ambulatory Visit: Payer: Self-pay | Admitting: Family Medicine

## 2022-01-10 DIAGNOSIS — Z1231 Encounter for screening mammogram for malignant neoplasm of breast: Secondary | ICD-10-CM

## 2022-01-14 ENCOUNTER — Ambulatory Visit
Admission: RE | Admit: 2022-01-14 | Discharge: 2022-01-14 | Disposition: A | Discharge status: Home / Self Care | Payer: BC Managed Care – PPO | Source: Ambulatory Visit | Attending: Family Medicine | Admitting: Family Medicine

## 2022-01-14 DIAGNOSIS — Z1231 Encounter for screening mammogram for malignant neoplasm of breast: Secondary | ICD-10-CM

## 2022-01-26 DIAGNOSIS — F17200 Nicotine dependence, unspecified, uncomplicated: Secondary | ICD-10-CM | POA: Diagnosis not present

## 2022-01-26 DIAGNOSIS — D487 Neoplasm of uncertain behavior of other specified sites: Secondary | ICD-10-CM | POA: Diagnosis not present

## 2022-01-26 DIAGNOSIS — D485 Neoplasm of uncertain behavior of skin: Secondary | ICD-10-CM | POA: Diagnosis not present

## 2022-01-27 DIAGNOSIS — D485 Neoplasm of uncertain behavior of skin: Secondary | ICD-10-CM | POA: Diagnosis not present

## 2022-01-27 DIAGNOSIS — C441122 Basal cell carcinoma of skin of right lower eyelid, including canthus: Secondary | ICD-10-CM | POA: Diagnosis not present

## 2022-03-09 DIAGNOSIS — M1811 Unilateral primary osteoarthritis of first carpometacarpal joint, right hand: Secondary | ICD-10-CM | POA: Diagnosis not present

## 2022-03-09 DIAGNOSIS — M1812 Unilateral primary osteoarthritis of first carpometacarpal joint, left hand: Secondary | ICD-10-CM | POA: Diagnosis not present

## 2022-03-09 DIAGNOSIS — M180 Bilateral primary osteoarthritis of first carpometacarpal joints: Secondary | ICD-10-CM | POA: Diagnosis not present

## 2022-03-21 DIAGNOSIS — Z720 Tobacco use: Secondary | ICD-10-CM | POA: Diagnosis not present

## 2022-03-29 DIAGNOSIS — C441122 Basal cell carcinoma of skin of right lower eyelid, including canthus: Secondary | ICD-10-CM | POA: Diagnosis not present

## 2022-03-30 DIAGNOSIS — S01111A Laceration without foreign body of right eyelid and periocular area, initial encounter: Secondary | ICD-10-CM | POA: Diagnosis not present

## 2022-03-30 DIAGNOSIS — Z483 Aftercare following surgery for neoplasm: Secondary | ICD-10-CM | POA: Diagnosis not present

## 2022-03-30 DIAGNOSIS — C441122 Basal cell carcinoma of skin of right lower eyelid, including canthus: Secondary | ICD-10-CM | POA: Diagnosis not present

## 2022-03-30 DIAGNOSIS — Z481 Encounter for planned postprocedural wound closure: Secondary | ICD-10-CM | POA: Diagnosis not present

## 2022-03-30 DIAGNOSIS — Z85828 Personal history of other malignant neoplasm of skin: Secondary | ICD-10-CM | POA: Diagnosis not present

## 2022-09-21 ENCOUNTER — Other Ambulatory Visit: Payer: Self-pay | Admitting: Sports Medicine

## 2022-09-21 ENCOUNTER — Ambulatory Visit
Admission: RE | Admit: 2022-09-21 | Discharge: 2022-09-21 | Disposition: A | Discharge status: Home / Self Care | Payer: Medicare Other | Source: Ambulatory Visit | Attending: Sports Medicine | Admitting: Sports Medicine

## 2022-09-21 DIAGNOSIS — M25562 Pain in left knee: Secondary | ICD-10-CM

## 2022-09-21 DIAGNOSIS — M25561 Pain in right knee: Secondary | ICD-10-CM

## 2022-12-30 ENCOUNTER — Other Ambulatory Visit: Payer: Self-pay | Admitting: Family Medicine

## 2022-12-30 DIAGNOSIS — E2839 Other primary ovarian failure: Secondary | ICD-10-CM

## 2023-02-06 ENCOUNTER — Other Ambulatory Visit: Payer: Self-pay | Admitting: Family Medicine

## 2023-02-06 ENCOUNTER — Telehealth: Payer: Self-pay | Admitting: *Deleted

## 2023-02-06 DIAGNOSIS — Z1231 Encounter for screening mammogram for malignant neoplasm of breast: Secondary | ICD-10-CM

## 2023-02-06 NOTE — Telephone Encounter (Signed)
Please call to set up LCS. Thanks.

## 2023-02-06 NOTE — Telephone Encounter (Signed)
Attempted to contact pt. Left VM for pt to call back regarding lung cancer screening. Will close this message and refer to referral notes.

## 2023-02-16 ENCOUNTER — Ambulatory Visit
Admission: RE | Admit: 2023-02-16 | Discharge: 2023-02-16 | Disposition: A | Discharge status: Home / Self Care | Payer: Medicare Other | Source: Ambulatory Visit | Attending: Family Medicine | Admitting: Family Medicine

## 2023-02-16 DIAGNOSIS — Z1231 Encounter for screening mammogram for malignant neoplasm of breast: Secondary | ICD-10-CM

## 2023-04-10 ENCOUNTER — Other Ambulatory Visit: Payer: Self-pay

## 2023-04-10 DIAGNOSIS — F17210 Nicotine dependence, cigarettes, uncomplicated: Secondary | ICD-10-CM

## 2023-04-10 DIAGNOSIS — Z87891 Personal history of nicotine dependence: Secondary | ICD-10-CM

## 2023-04-10 DIAGNOSIS — Z122 Encounter for screening for malignant neoplasm of respiratory organs: Secondary | ICD-10-CM

## 2023-04-14 ENCOUNTER — Encounter: Payer: Self-pay | Admitting: Adult Health

## 2023-04-14 ENCOUNTER — Ambulatory Visit (INDEPENDENT_AMBULATORY_CARE_PROVIDER_SITE_OTHER): Payer: Medicare Other | Admitting: Adult Health

## 2023-04-14 DIAGNOSIS — F17210 Nicotine dependence, cigarettes, uncomplicated: Secondary | ICD-10-CM

## 2023-04-14 NOTE — Patient Instructions (Signed)

## 2023-04-14 NOTE — Progress Notes (Signed)
  Virtual Visit via Telephone Note  I connected with Vianne Bulls , 04/14/23 10:05 AM by a telemedicine application and verified that I am speaking with the correct person using two identifiers.  Location: Patient: home Provider: home   I discussed the limitations of evaluation and management by telemedicine and the availability of in person appointments. The patient expressed understanding and agreed to proceed.   Shared Decision Making Visit Lung Cancer Screening Program [phone])   Eligibility: 67 y.o. Pack Years Smoking History Calculation = 35 pack years (# packs/per year x # years smoked) Recent History of coughing up blood  no Unexplained weight loss? no ( >Than 15 pounds within the last 6 months ) Prior History Lung / other cancer no (Diagnosis within the last 5 years already requiring surveillance chest CT Scans). Smoking Status Current Smoker  Visit Components: Discussion included one or more decision making aids. YES Discussion included risk/benefits of screening. YES Discussion included potential follow up diagnostic testing for abnormal scans. YES Discussion included meaning and risk of over diagnosis. YES Discussion included meaning and risk of False Positives. YES Discussion included meaning of total radiation exposure. YES  Counseling Included: Importance of adherence to annual lung cancer LDCT screening. YES Impact of comorbidities on ability to participate in the program. YES Ability and willingness to under diagnostic treatment. YES  Smoking Cessation Counseling: Current Smokers:  Discussed importance of smoking cessation. yes Information about tobacco cessation classes and interventions provided to patient. yes Patient provided with "ticket" for LDCT Scan. yes Symptomatic Patient. no Diagnosis Code: Tobacco Use Z72.0 Asymptomatic Patient yes  Counseling (Intermediate counseling: > three minutes counseling) O1308 Information about tobacco cessation  classes and interventions provided to patient. Yes Patient provided with "ticket" for LDCT Scan. yes Written Order for Lung Cancer Screening with LDCT placed in Epic. Yes (CT Chest Lung Cancer Screening Low Dose W/O CM) [id number]  Z12.2-Screening of respiratory organs Z87.891-Personal history of nicotine dependence   Danford Bad 04/14/23

## 2023-04-21 ENCOUNTER — Ambulatory Visit
Admission: RE | Admit: 2023-04-21 | Discharge: 2023-04-21 | Disposition: A | Discharge status: Home / Self Care | Payer: Medicare Other | Source: Ambulatory Visit | Attending: Acute Care | Admitting: Acute Care

## 2023-04-21 DIAGNOSIS — Z122 Encounter for screening for malignant neoplasm of respiratory organs: Secondary | ICD-10-CM

## 2023-04-21 DIAGNOSIS — Z87891 Personal history of nicotine dependence: Secondary | ICD-10-CM

## 2023-04-21 DIAGNOSIS — F17210 Nicotine dependence, cigarettes, uncomplicated: Secondary | ICD-10-CM

## 2023-05-18 ENCOUNTER — Telehealth: Payer: Self-pay | Admitting: *Deleted

## 2023-05-18 NOTE — Telephone Encounter (Signed)
Returned call. Advised results are still pending and are taking 2-3 weeks to result. Patient verbalized understanding. Staff to call patient when results are back.

## 2023-05-18 NOTE — Telephone Encounter (Signed)
PT wants to know results of Lung Scan. Adv results not in yet but I would ask a LCS rep call her.  [phone] is her #

## 2023-05-24 NOTE — Telephone Encounter (Signed)
NFN 

## 2023-05-31 ENCOUNTER — Telehealth: Payer: Self-pay | Admitting: Acute Care

## 2023-05-31 NOTE — Telephone Encounter (Signed)
OK to call patient and let her know there is an 8.8 mm nodule in her right upper lobe that we will re-evaluate for stability in 3 months. This is her baseline scan and th short interval follow up will help determine if this is a stable nodule.  Please order a 3 month follow up scan due after 07/22/2023 . Please fax results to PCP and let her know plan is for a 3 month follow up.  Thanks so much

## 2023-06-01 ENCOUNTER — Other Ambulatory Visit: Payer: Self-pay

## 2023-06-01 DIAGNOSIS — Z122 Encounter for screening for malignant neoplasm of respiratory organs: Secondary | ICD-10-CM

## 2023-06-01 DIAGNOSIS — Z87891 Personal history of nicotine dependence: Secondary | ICD-10-CM

## 2023-06-01 NOTE — Telephone Encounter (Signed)
Spoke with patient. Reviewed results. CT ordered and scheduled for 07/31/2023 at 10:20am. Pt has no questions.

## 2023-06-01 NOTE — Telephone Encounter (Signed)
 LVM for patient requesting a call back.

## 2023-07-23 ENCOUNTER — Encounter: Payer: Self-pay | Admitting: Internal Medicine

## 2023-07-31 ENCOUNTER — Ambulatory Visit
Admission: RE | Admit: 2023-07-31 | Discharge: 2023-07-31 | Disposition: A | Discharge status: Home / Self Care | Payer: Medicare Other | Source: Ambulatory Visit | Attending: Acute Care | Admitting: Acute Care

## 2023-07-31 DIAGNOSIS — Z122 Encounter for screening for malignant neoplasm of respiratory organs: Secondary | ICD-10-CM

## 2023-07-31 DIAGNOSIS — Z87891 Personal history of nicotine dependence: Secondary | ICD-10-CM

## 2023-08-07 ENCOUNTER — Ambulatory Visit
Admission: RE | Admit: 2023-08-07 | Discharge: 2023-08-07 | Disposition: A | Discharge status: Home / Self Care | Payer: Medicare Other | Source: Ambulatory Visit | Attending: Family Medicine | Admitting: Family Medicine

## 2023-08-07 DIAGNOSIS — E2839 Other primary ovarian failure: Secondary | ICD-10-CM

## 2023-08-11 ENCOUNTER — Telehealth: Payer: Self-pay | Admitting: Acute Care

## 2023-08-11 DIAGNOSIS — R911 Solitary pulmonary nodule: Secondary | ICD-10-CM

## 2023-08-11 NOTE — Telephone Encounter (Signed)
Reviewed LDCT results with Kandice Robinsons NP - patient had previous 4A scan and had 3 month follow up scan (07/31/2023). The concerning nodule has shrunk in size and is warranting a 6 month follow up scan to assure stability. Will need to speak with patient to relay results and recommendations per Kandice Robinsons NP.    IMPRESSION: 1. Lung-RADS 3, probably benign findings. Short-term follow-up in 6 months is recommended with repeat low-dose chest CT without contrast (please use the following order, "CT CHEST LCS NODULE FOLLOW-UP W/O CM"). The posterior right upper lobe pulmonary nodule is similar. 2.  Emphysema (ICD10-J43.9).     Electronically Signed   By: Jeronimo Greaves M.D.   On: 08/08/2023 12:25

## 2023-08-14 NOTE — Telephone Encounter (Signed)
Called and spoke to patient and informed her of the results and recommendations per Kandice Robinsons NP. Patient verbalized understanding and is agreeable to 6 month follow up scan, due 01/2024. Questions were answered at the time of the call. Order placed for follow up CT. Results and plan sent to PCP.

## 2024-01-29 ENCOUNTER — Encounter (HOSPITAL_BASED_OUTPATIENT_CLINIC_OR_DEPARTMENT_OTHER): Admitting: Radiology

## 2024-01-29 ENCOUNTER — Ambulatory Visit (HOSPITAL_BASED_OUTPATIENT_CLINIC_OR_DEPARTMENT_OTHER): Admitting: Radiology

## 2024-02-13 ENCOUNTER — Other Ambulatory Visit: Payer: Self-pay | Admitting: Family Medicine

## 2024-02-13 DIAGNOSIS — Z1231 Encounter for screening mammogram for malignant neoplasm of breast: Secondary | ICD-10-CM

## 2024-02-14 ENCOUNTER — Inpatient Hospital Stay (HOSPITAL_BASED_OUTPATIENT_CLINIC_OR_DEPARTMENT_OTHER)
Admission: RE | Admit: 2024-02-14 | Discharge: 2024-02-14 | Discharge status: Home / Self Care | Source: Ambulatory Visit | Attending: Acute Care | Admitting: Acute Care

## 2024-02-14 DIAGNOSIS — Z87891 Personal history of nicotine dependence: Secondary | ICD-10-CM

## 2024-02-14 DIAGNOSIS — R911 Solitary pulmonary nodule: Secondary | ICD-10-CM | POA: Diagnosis not present

## 2024-03-04 ENCOUNTER — Other Ambulatory Visit: Payer: Self-pay

## 2024-03-04 DIAGNOSIS — Z87891 Personal history of nicotine dependence: Secondary | ICD-10-CM

## 2024-03-04 DIAGNOSIS — Z122 Encounter for screening for malignant neoplasm of respiratory organs: Secondary | ICD-10-CM

## 2024-03-06 ENCOUNTER — Ambulatory Visit
Admission: RE | Admit: 2024-03-06 | Discharge: 2024-03-06 | Disposition: A | Discharge status: Home / Self Care | Source: Ambulatory Visit | Attending: Family Medicine | Admitting: Family Medicine

## 2024-03-06 DIAGNOSIS — Z1231 Encounter for screening mammogram for malignant neoplasm of breast: Secondary | ICD-10-CM

## 2024-04-20 ENCOUNTER — Other Ambulatory Visit: Payer: Self-pay | Admitting: Medical Genetics

## 2024-05-29 ENCOUNTER — Other Ambulatory Visit: Payer: Self-pay

## 2024-05-29 DIAGNOSIS — Z006 Encounter for examination for normal comparison and control in clinical research program: Secondary | ICD-10-CM

## 2024-06-05 LAB — GENECONNECT MOLECULAR SCREEN: Genetic Analysis Overall Interpretation: NEGATIVE
